# Patient Record
Sex: Female | Born: 1990 | Race: Black or African American | Hispanic: No | Marital: Single | Smoking: Current every day smoker
Health system: Southern US, Community
[De-identification: ages and names within clinical notes are randomized; demographics above are authoritative.]

## PROBLEM LIST (undated history)

## (undated) DIAGNOSIS — E669 Obesity, unspecified: Secondary | ICD-10-CM

## (undated) HISTORY — DX: Obesity, unspecified: E66.9

## (undated) HISTORY — PX: TONSILLECTOMY: SUR1361

---
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
## 2009-09-27 ENCOUNTER — Emergency Department: Payer: Self-pay | Admitting: Emergency Medicine

## 2010-12-13 ENCOUNTER — Emergency Department: Payer: Self-pay | Admitting: Emergency Medicine

## 2012-04-22 ENCOUNTER — Emergency Department: Payer: Self-pay | Admitting: Emergency Medicine

## 2013-06-08 ENCOUNTER — Emergency Department: Payer: Self-pay | Admitting: Emergency Medicine

## 2013-06-08 LAB — CBC
HCT: 40 % (ref 35.0–47.0)
HGB: 13.2 g/dL (ref 12.0–16.0)
MCH: 27.8 pg (ref 26.0–34.0)
MCHC: 33.1 g/dL (ref 32.0–36.0)
MCV: 84 fL (ref 80–100)
Platelet: 356 10*3/uL (ref 150–440)
RBC: 4.77 10*6/uL (ref 3.80–5.20)
RDW: 13.2 % (ref 11.5–14.5)
WBC: 11.9 10*3/uL — AB (ref 3.6–11.0)

## 2013-06-08 LAB — HCG, QUANTITATIVE, PREGNANCY: Beta Hcg, Quant.: 86926 m[IU]/mL — ABNORMAL HIGH

## 2013-07-16 ENCOUNTER — Emergency Department: Payer: Self-pay | Admitting: Emergency Medicine

## 2013-07-16 LAB — COMPREHENSIVE METABOLIC PANEL
ALK PHOS: 43 U/L — AB
Albumin: 2.9 g/dL — ABNORMAL LOW (ref 3.4–5.0)
Anion Gap: 7 (ref 7–16)
BUN: 5 mg/dL — AB (ref 7–18)
Bilirubin,Total: 0.2 mg/dL (ref 0.2–1.0)
Calcium, Total: 9.1 mg/dL (ref 8.5–10.1)
Chloride: 105 mmol/L (ref 98–107)
Co2: 25 mmol/L (ref 21–32)
Creatinine: 0.57 mg/dL — ABNORMAL LOW (ref 0.60–1.30)
EGFR (African American): >60
EGFR (Non-African Amer.): >60
GLUCOSE: 85 mg/dL (ref 65–99)
Osmolality: 270 (ref 275–301)
Potassium: 3.8 mmol/L (ref 3.5–5.1)
SGOT(AST): 45 U/L — ABNORMAL HIGH (ref 15–37)
SGPT (ALT): 68 U/L (ref 12–78)
Sodium: 137 mmol/L (ref 136–145)
Total Protein: 6.8 g/dL (ref 6.4–8.2)

## 2013-07-16 LAB — URINALYSIS, COMPLETE
BILIRUBIN, UR: NEGATIVE
Bacteria: NONE SEEN
Blood: NEGATIVE
Glucose,UR: NEGATIVE mg/dL (ref 0–75)
Ketone: NEGATIVE
Nitrite: NEGATIVE
Ph: 5 (ref 4.5–8.0)
Protein: NEGATIVE
RBC,UR: 2 /HPF (ref 0–5)
Specific Gravity: 1.028 (ref 1.003–1.030)
WBC UR: 16 /HPF (ref 0–5)

## 2013-07-16 LAB — CBC WITH DIFFERENTIAL/PLATELET
BASOS ABS: 0.1 10*3/uL (ref 0.0–0.1)
BASOS PCT: 0.8 %
EOS ABS: 0.1 10*3/uL (ref 0.0–0.7)
EOS PCT: 0.5 %
HCT: 37.3 % (ref 35.0–47.0)
HGB: 12.1 g/dL (ref 12.0–16.0)
LYMPHS ABS: 1.9 10*3/uL (ref 1.0–3.6)
Lymphocyte %: 15.8 %
MCH: 27.5 pg (ref 26.0–34.0)
MCHC: 32.4 g/dL (ref 32.0–36.0)
MCV: 85 fL (ref 80–100)
MONOS PCT: 7 %
Monocyte #: 0.8 x10 3/mm (ref 0.2–0.9)
NEUTROS ABS: 9 10*3/uL — AB (ref 1.4–6.5)
Neutrophil %: 75.9 %
Platelet: 271 10*3/uL (ref 150–440)
RBC: 4.4 10*6/uL (ref 3.80–5.20)
RDW: 13.9 % (ref 11.5–14.5)
WBC: 11.8 10*3/uL — ABNORMAL HIGH (ref 3.6–11.0)

## 2013-07-21 ENCOUNTER — Emergency Department: Payer: Self-pay | Admitting: Emergency Medicine

## 2013-07-21 LAB — CBC
HCT: 40 % (ref 35.0–47.0)
HGB: 12.7 g/dL (ref 12.0–16.0)
MCH: 26.9 pg (ref 26.0–34.0)
MCHC: 31.8 g/dL — ABNORMAL LOW (ref 32.0–36.0)
MCV: 85 fL (ref 80–100)
PLATELETS: 311 10*3/uL (ref 150–440)
RBC: 4.74 10*6/uL (ref 3.80–5.20)
RDW: 14.1 % (ref 11.5–14.5)
WBC: 11.6 10*3/uL — AB (ref 3.6–11.0)

## 2013-07-21 LAB — URINALYSIS, COMPLETE
Bacteria: NONE SEEN
Bilirubin,UR: NEGATIVE
GLUCOSE, UR: NEGATIVE mg/dL (ref 0–75)
KETONE: NEGATIVE
NITRITE: NEGATIVE
PROTEIN: NEGATIVE
Ph: 5 (ref 4.5–8.0)
RBC,UR: 1 /HPF (ref 0–5)
Specific Gravity: 1.018 (ref 1.003–1.030)

## 2013-07-21 LAB — WET PREP, GENITAL

## 2013-07-21 LAB — HCG, QUANTITATIVE, PREGNANCY: Beta Hcg, Quant.: 43557 m[IU]/mL — ABNORMAL HIGH

## 2013-07-21 LAB — GC/CHLAMYDIA PROBE AMP

## 2013-10-01 ENCOUNTER — Observation Stay: Payer: Self-pay

## 2013-10-16 ENCOUNTER — Observation Stay: Payer: Self-pay | Admitting: Obstetrics and Gynecology

## 2013-12-14 HISTORY — PX: OTHER SURGICAL HISTORY: SHX169

## 2013-12-25 ENCOUNTER — Observation Stay: Payer: Self-pay | Admitting: Obstetrics & Gynecology

## 2014-01-05 ENCOUNTER — Inpatient Hospital Stay: Payer: Self-pay | Admitting: Obstetrics and Gynecology

## 2014-01-05 LAB — CBC WITH DIFFERENTIAL/PLATELET
BASOS ABS: 0.1 10*3/uL (ref 0.0–0.1)
Basophil %: 0.6 %
EOS ABS: 0 10*3/uL (ref 0.0–0.7)
Eosinophil %: 0.3 %
HCT: 39.4 % (ref 35.0–47.0)
HGB: 12.4 g/dL (ref 12.0–16.0)
LYMPHS ABS: 1.3 10*3/uL (ref 1.0–3.6)
Lymphocyte %: 9.1 %
MCH: 25.7 pg — AB (ref 26.0–34.0)
MCHC: 31.6 g/dL — AB (ref 32.0–36.0)
MCV: 81 fL (ref 80–100)
Monocyte #: 0.9 x10 3/mm (ref 0.2–0.9)
Monocyte %: 6 %
Neutrophil #: 12 10*3/uL — ABNORMAL HIGH (ref 1.4–6.5)
Neutrophil %: 84 %
PLATELETS: 290 10*3/uL (ref 150–440)
RBC: 4.84 10*6/uL (ref 3.80–5.20)
RDW: 14.5 % (ref 11.5–14.5)
WBC: 14.2 10*3/uL — AB (ref 3.6–11.0)

## 2014-01-07 LAB — HEMATOCRIT: HCT: 33.6 % — ABNORMAL LOW (ref 35.0–47.0)

## 2014-06-23 NOTE — H&P (Signed)
L&D Evaluation:  History:  HPI 24 yo G1 at 423w6d, EDD of 01/09/14 per LMP & 15 wk US, presents with reports of decreased fetal movement for the last 3 days. She also reports not eating or drinking anything since last night. She denies vb, lof or ctx. [facility], tx from [facility] at 18 wks. She is O+, RI, VI.   Presents with decreased fetal movement   Patient's Medical History No Chronic Illness   Patient's Surgical History tonsillectomy   Medications Pre Natal Vitamins  Diclegis   Allergies NKDA   Social History none   Family History Non-Contributory   Exam:  Vital Signs stable   General no apparent distress   Mental Status clear   Abdomen gravid, non-tender   FHT normal rate with no decels, 135-140 baseline, +accels, no decels noted, moderate variability + movement heard on monitor and felt by patient   Ucx absent   Skin dry, no lesions, no rashes   Lymph no lymphadenopathy   Impression:  Impression IUP at 3923w6d, Reactive NST- Cat 1 FHT- appropriate for gestational age.   Plan:  Plan discharge   Comments FKC discussed. PTL precautions.   Follow Up Appointment already scheduled   Electronic Signatures: Jannet MantisSubudhi, Bayli Quesinberry (CNM)  (Signed 03-Sep-15 10:57)  Authored: L&D Evaluation   Last Updated: 03-Sep-15 10:57 by Jannet MantisSubudhi, Geraldina Parrott (CNM)

## 2014-06-23 NOTE — H&P (Signed)
L&D Evaluation:  History Expanded:  HPI 24 yo G1 at 3335w5d, EDD of 01/09/14 per LMP & 15 wk US, presents with c/o vaginal spotting and discharge with odor and itching. [facility] from [facility] at 18 wks. NO VB, LOF, decreased FM or contractions noted.   Presents with discharge   Patient's Medical History No Chronic Illness   Patient's Surgical History tonsillectomy   Medications Pre Natal Vitamins  Diclegis   Allergies NKDA   Social History none   Exam:  Vital Signs stable   General no apparent distress   Mental Status clear   Abdomen gravid, non-tender   Pelvic wet mount: +yeast +clue + whiff, neg trich   FHT +FHTs   Ucx absent   Impression:  Impression IUP at 25 wks, BV, vaginal yeast   Plan:  Plan discharge   Comments Rx for Diflucan and Flagyl given   Follow Up Appointment already scheduled   Electronic Signatures: Vella KohlerBrothers, Donna David (CNM)  (Signed 19-Aug-15 23:33)  Authored: L&D Evaluation   Last Updated: 19-Aug-15 23:33 by Vella KohlerBrothers, Donna David (CNM)

## 2014-06-23 NOTE — H&P (Signed)
L&D Evaluation:  History Expanded:  HPI 24 yo G1 at 3259w3d, EDD of 01/09/14 per LMP & 15 wk US, presents with regular contractions and vaginal bleeding this am. Denies LOF or decreased FM. [facility], [facility]. UDS + MJ in April, negative on 12/24/13. BMI 38, early 1 hr 123, 28 wk 1 hr: 152, she has not had 3 hr GTT but checked FSBS for a few days, only 6/20 elevated. Pt has not checked her FSBS this past week as requested. Had TDAP at 30 wks. Plans to breastfeed/pump. Nexplanon desired PP, device at office.   Blood Type (Maternal) O positive   Group B Strep Results Maternal (Result >5wks must be treated as unknown) positive   Maternal HIV Negative   Maternal Syphilis Ab Nonreactive   Maternal Varicella Immune   Rubella Results (Maternal) immune   Maternal T-Dap Immune   Presents with contractions, vaginal bleeding   Patient's Medical History No Chronic Illness   Patient's Surgical History tonsillectomy & adenoidectomy   Medications Pre Natal Vitamins   Allergies NKDA   Social History none  +MJ in early pregnancy, negative in November   Family History Non-Contributory   ROS:  ROS All systems were reviewed.  HEENT, CNS, GI, GU, Respiratory, CV, Renal and Musculoskeletal systems were found to be normal.   Exam:  Vital Signs stable   Urine Protein not completed   General no apparent distress   Mental Status clear   Chest clear   Heart no murmur/gallop/rubs   Abdomen gravid, tender with contractions   Estimated Fetal Weight Average for gestational age, EFW 40% per US at 37 wks   Fetal Position vertex   Pelvic 4.5/90/-1   Mebranes Intact   FHT normal rate with no decels, baseline 130, mod variability, + accelerations, no decels   Ucx regular, q 5-7 min   Impression:  Impression early labor   Plan:  Comments Admission for labor, begin Ampicillin for + GBS. Encourage ambulation as tolerated Random glucose: 82 about 3 hrs after eating    Electronic Signatures: Vella KohlerBrothers, Daziah Hesler K (CNM)  (Signed 23-Nov-15 14:55)  Authored: L&D Evaluation   Last Updated: 23-Nov-15 14:55 by Vella KohlerBrothers, Letecia Arps K (CNM)

## 2014-06-23 NOTE — H&P (Signed)
L&D Evaluation:  History:  HPI 24 yo G1 at 8938w1d, EDD of 01/09/14 per LMP & 15 wk US, presents with some mild contractions and vaginal discharge.  Patient had intercourse last night.    Denies LOF, VB, +FM.  [facility] from [facility] at 18 wks.  She is O+, RI, VI.   Presents with contractions, decreased fetal movement   Patient's Medical History No Chronic Illness   Patient's Surgical History tonsillectomy   Medications Pre Natal Vitamins  Diclegis   Allergies NKDA   Social History none   Family History Non-Contributory   ROS:  ROS All systems were reviewed.  HEENT, CNS, GI, GU, Respiratory, CV, Renal and Musculoskeletal systems were found to be normal.   Exam:  Vital Signs stable   Urine Protein not completed   General no apparent distress   Mental Status clear   Abdomen gravid, non-tender   Estimated Fetal Weight Average for gestational age   Fetal Position cephalic by leopolds   Pelvic C/L/H   Mebranes Intact   FHT normal rate with no decels   Fetal Heart Rate 125   Impression:  Impression irregular contractions   Plan:  Plan discharge   Comments Dicussed labor signs and symptoms, call for ctx 5min apart for at least an hour, VB, LOF or decreased FM. Patient to be discharged home, keep next appointment.   Follow Up Appointment already scheduled   Electronic Signatures: Kendalyn Cranfield, Elenora Fenderhelsea C (MD)  (Signed 12-Nov-15 20:09)  Authored: L&D Evaluation   Last Updated: 12-Nov-15 20:09 by Markell Schrier, Elenora Fenderhelsea C (MD)

## 2014-12-29 ENCOUNTER — Emergency Department
Admission: EM | Admit: 2014-12-29 | Discharge: 2014-12-29 | Disposition: A | Discharge status: Home / Self Care | Payer: Self-pay | Attending: Student | Admitting: Student

## 2014-12-29 ENCOUNTER — Encounter: Payer: Self-pay | Admitting: Emergency Medicine

## 2014-12-29 DIAGNOSIS — F17200 Nicotine dependence, unspecified, uncomplicated: Secondary | ICD-10-CM | POA: Insufficient documentation

## 2014-12-29 DIAGNOSIS — Z3202 Encounter for pregnancy test, result negative: Secondary | ICD-10-CM | POA: Insufficient documentation

## 2014-12-29 DIAGNOSIS — N390 Urinary tract infection, site not specified: Secondary | ICD-10-CM | POA: Insufficient documentation

## 2014-12-29 LAB — URINALYSIS COMPLETE WITH MICROSCOPIC (ARMC ONLY)
Bilirubin Urine: NEGATIVE
GLUCOSE, UA: NEGATIVE mg/dL
Ketones, ur: NEGATIVE mg/dL
Nitrite: POSITIVE — AB
Protein, ur: 100 mg/dL — AB
Specific Gravity, Urine: 1.021 (ref 1.005–1.030)
pH: 7 (ref 5.0–8.0)

## 2014-12-29 LAB — POCT PREGNANCY, URINE: Preg Test, Ur: NEGATIVE

## 2014-12-29 MED ORDER — CEPHALEXIN 500 MG PO CAPS: 500.0000 mg | ORAL_CAPSULE | Freq: Four times a day (QID) | ORAL | Status: AC

## 2014-12-29 NOTE — ED Notes (Signed)
Pt to ed with c/o urinary frequency, burning, and pain x 2 days.

## 2014-12-29 NOTE — ED Provider Notes (Signed)
CSN: [id number]     Arrival date & time 12/29/14  1314 History   First MD Initiated Contact with Patient 12/29/14 1408     Chief Complaint  Patient presents with  . Urinary Frequency     HPI Comments: [age] year old female presents today complaining of urinary frequency burning and pain. She's not had any back or lower abdominal pain. No fevers or chills. Has not taken anything over-the-counter. Her last normal menstrual period was over a year ago, she uses Nexplanon for birth control.  The history is provided by the patient.    History reviewed. No pertinent past medical history. History reviewed. No pertinent past surgical history. History reviewed. No pertinent family history. Social History  Substance Use Topics  . Smoking status: Current Every Day Smoker  . Smokeless tobacco: None  . Alcohol Use: Yes   OB History    Gravida Para Term Preterm AB TAB SAB Ectopic Multiple Living   1         1     Review of Systems  Genitourinary: Positive for dysuria, urgency and frequency. Negative for flank pain, vaginal bleeding, vaginal discharge and pelvic pain.  All other systems reviewed and are negative.     Allergies  Review of patient's allergies indicates no known allergies.  Home Medications   Prior to Admission medications   Medication Sig Start Date End Date Taking? Authorizing Provider  cephALEXin (KEFLEX) 500 MG capsule Take 1 capsule (500 mg total) by mouth 4 (four) times daily. 12/29/14 01/08/15  Wilber OliphantEmma Weavil V, PA-C   BP 109/66 mmHg  Pulse 92  Temp(Src) 98.1 F (36.7 C) (Oral)  Resp 20  Ht 5\' 11"  (1.803 m)  Wt 250 lb (113.399 kg)  BMI 34.88 kg/m2  SpO2 100% Physical Exam  Constitutional: She is oriented to person, place, and time. Vital signs are normal. She appears well-developed and well-nourished. She is active.  Non-toxic appearance. She does not have a sickly appearance. She does not appear ill.  HENT:  Head: Normocephalic and atraumatic.  Cardiovascular:  Normal rate, regular rhythm, normal heart sounds and intact distal pulses.  Exam reveals no gallop and no friction rub.   No murmur heard. Pulmonary/Chest: Effort normal and breath sounds normal. No respiratory distress. She has no wheezes. She has no rales.  Abdominal: Soft. She exhibits no distension. There is no tenderness. There is no rebound and no guarding.  No CVA tenderness   Musculoskeletal: Normal range of motion.  Neurological: She is alert and oriented to person, place, and time.  Skin: Skin is warm and dry.  Psychiatric: She has a normal mood and affect. Her behavior is normal. Judgment and thought content normal.  Nursing note and vitals reviewed.   ED Course  Procedures (including critical care time) Labs Review Labs Reviewed  URINALYSIS COMPLETEWITH MICROSCOPIC ([facility] ONLY) - Abnormal; Notable for the following:    Color, Urine AMBER (*)    APPearance CLOUDY (*)    Hgb urine dipstick 1+ (*)    Protein, ur 100 (*)    Nitrite POSITIVE (*)    Leukocytes, UA 2+ (*)    Bacteria, UA RARE (*)    Squamous Epithelial / LPF 6-30 (*)    All other components within normal limits  POC URINE PREG, ED    Imaging Review No results found. I have personally reviewed and evaluated these images and lab results as part of my medical decision-making.   EKG Interpretation None      MDM  Keflex  QID x 10 days Drink plenty of water AZO over the counter  Final diagnoses:  UTI (lower urinary tract infection)        Wilber Oliphant V, PA-C 12/29/14 1527  Gayla Doss, MD 12/30/14 [phone]

## 2015-06-14 ENCOUNTER — Emergency Department
Admission: EM | Admit: 2015-06-14 | Discharge: 2015-06-14 | Disposition: A | Discharge status: Home / Self Care | Payer: Self-pay | Attending: Emergency Medicine | Admitting: Emergency Medicine

## 2015-06-14 ENCOUNTER — Encounter: Payer: Self-pay | Admitting: Emergency Medicine

## 2015-06-14 DIAGNOSIS — F17200 Nicotine dependence, unspecified, uncomplicated: Secondary | ICD-10-CM | POA: Insufficient documentation

## 2015-06-14 DIAGNOSIS — H6503 Acute serous otitis media, bilateral: Secondary | ICD-10-CM | POA: Insufficient documentation

## 2015-06-14 DIAGNOSIS — H6693 Otitis media, unspecified, bilateral: Secondary | ICD-10-CM

## 2015-06-14 DIAGNOSIS — J0140 Acute pansinusitis, unspecified: Secondary | ICD-10-CM | POA: Insufficient documentation

## 2015-06-14 MED ORDER — AMOXICILLIN 500 MG PO CAPS
500.0000 mg | ORAL_CAPSULE | Freq: Three times a day (TID) | ORAL | Status: DC
Start: 2015-06-14 — End: 2018-07-16

## 2015-06-14 NOTE — ED Provider Notes (Signed)
[facility] Emergency Department Provider Note  ____________________________________________  Time seen: Approximately 5:04 PM  I have reviewed the triage vital signs and the nursing notes.   HISTORY  Chief Complaint Dental Pain   HPI Donna David is a 25 y.o. female, NAD, presents to the emergency room today with left facial and dental pain for the past few days. She states she went to the dentist today and told her that nothing was wrong with her teeth. A child at her home recently had an ear infection. Denies fever, cough, SOB, and chest pain.   History reviewed. No pertinent past medical history.  There are no active problems to display for this patient.   History reviewed. No pertinent past surgical history.  Current Outpatient Rx  Name  Route  Sig  Dispense  Refill  . amoxicillin (AMOXIL) 500 MG capsule   Oral   Take 1 capsule (500 mg total) by mouth 3 (three) times daily.   30 capsule   0     Allergies Review of patient's allergies indicates no known allergies.  No family history on file.  Social History Social History  Substance Use Topics  . Smoking status: Current Every Day Smoker  . Smokeless tobacco: None  . Alcohol Use: Yes    Review of Systems Constitutional: No fever/chills Eyes: No visual changes. ENT: No sore throat. Positive for facial pain. Cardiovascular: Denies chest pain. Respiratory: Denies shortness of breath. Gastrointestinal: No abdominal pain.  No nausea, no vomiting.  No diarrhea.  No constipation. Skin: Negative for rash. Neurological: Positivefor headaches. Negative for focal weakness or numbness.  ____________________________________________   PHYSICAL EXAM:  VITAL SIGNS: ED Triage Vitals  Enc Vitals Group     BP 06/14/15 1637 125/83 mmHg     Pulse Rate 06/14/15 1637 86     Resp 06/14/15 1637 18     Temp 06/14/15 1637 98.2 F (36.8 C)     Temp Source 06/14/15 1637 Oral     SpO2 06/14/15  1637 100 %     Weight 06/14/15 1637 260 lb (117.935 kg)     Height 06/14/15 1637  (1.803 m)     Head Cir --      Peak Flow --      Pain Score 06/14/15 1638 9     Pain Loc --      Pain Edu? --      Excl. in GC? --     Constitutional: Alert and oriented. Well appearing and in no acute distress. Eyes: Conjunctivae are normal. EOMI. Ears: Left ear- erythematous, mildly bulging, decreased light reflex. TM intact but with fluid collection behind it. Right ear- decreased light reflex, TM intact without fluid collection. Head: Atraumatic. Nose: No congestion/rhinnorhea. Mouth/Throat: Mucous membranes are moist.  Oropharynx non-erythematous. No edema or signs of ludwigs angina. Hematological/Lymphatic/Immunilogical: Mild cervical lymphadenopathy. Cardiovascular: Good peripheral circulation. Respiratory: Normal respiratory effort.  No retractions. Neurologic:  Normal speech and language. No gross focal neurologic deficits are appreciated. No gait instability. Skin:  Skin is warm, dry and intact. No rash noted. Psychiatric: Mood and affect are normal. Speech and behavior are normal.  ____________________________________________ PROCEDURES  Procedure(s) performed: None  Critical Care performed: No  ____________________________________________   INITIAL IMPRESSION / ASSESSMENT AND PLAN / ED COURSE  Pertinent labs & imaging results that were available during my care of the patient were reviewed by me and considered in my medical decision making (see chart for details).  Bilateral acute otitis  media. Prescription for amoxil x 10 days given upon discharge. Advised to use flonase to reduce the pressure that she is experiencing in her sinuses. She was advised to continue to take OTC pain relievers. Advised to schedule an appointment with Dr. Elenore RotaJuengel if symptoms worsen or persist. ____________________________________________   FINAL CLINICAL IMPRESSION(S) / ED DIAGNOSES  Final  diagnoses:  Bilateral acute otitis media, recurrence not specified, unspecified otitis media type  Acute pansinusitis, recurrence not specified      NEW MEDICATIONS STARTED DURING THIS VISIT:  Discharge Medication List as of 06/14/2015  5:11 PM    START taking these medications   Details  amoxicillin (AMOXIL) 500 MG capsule Take 1 capsule (500 mg total) by mouth 3 (three) times daily., Starting 06/14/2015, Until Discontinued, Print         Note:  This document was prepared using Dragon voice recognition software and may include unintentional dictation errors.    Tommi RumpsRhonda L Hartleigh Edmonston, PA-C 06/14/15 1733  Loleta Roseory Forbach, MD 06/14/15 2113

## 2015-06-14 NOTE — ED Notes (Signed)
Left side of mouth and ear hurt

## 2015-06-14 NOTE — Discharge Instructions (Signed)
Otitis Media, Adult Otitis media is redness, soreness, and puffiness (swelling) in the space just behind your eardrum (middle ear). It may be caused by allergies or infection. It often happens along with a cold. HOME CARE  Take your medicine as told. Finish it even if you start to feel better.  Only take over-the-counter or prescription medicines for pain, discomfort, or fever as told by your doctor.  Follow up with your doctor as told. GET HELP IF:  You have otitis media only in one ear, or bleeding from your nose, or both.  You notice a lump on your neck.  You are not getting better in 3-5 days.  You feel worse instead of better. GET HELP RIGHT AWAY IF:   You have pain that is not helped with medicine.  You have puffiness, redness, or pain around your ear.  You get a stiff neck.  You cannot move part of your face (paralysis).  You notice that the bone behind your ear hurts when you touch it. MAKE SURE YOU:   Understand these instructions.  Will watch your condition.  Will get help right away if you are not doing well or get worse.   This information is not intended to replace advice given to you by your health care provider. Make sure you discuss any questions you have with your health care provider.   Document Released: 07/19/2007 Document Revised: 02/20/2014 Document Reviewed: 08/27/2012 Elsevier Interactive Patient Education [employer]  Please take all antibiotics prescribed to lessen your chance of recurrence. Make an appointment with the above physician if symptoms worsen or persist.

## 2015-06-14 NOTE — ED Notes (Signed)
States she has had left ear pain and gumline pain for about 5 days  Afebrile on arrival

## 2018-01-12 ENCOUNTER — Emergency Department: Payer: Self-pay

## 2018-01-12 ENCOUNTER — Emergency Department
Admission: EM | Admit: 2018-01-12 | Discharge: 2018-01-12 | Discharge status: Against Medical Advice | Payer: Self-pay | Attending: Emergency Medicine | Admitting: Emergency Medicine

## 2018-01-12 ENCOUNTER — Encounter: Payer: Self-pay | Admitting: Emergency Medicine

## 2018-01-12 ENCOUNTER — Other Ambulatory Visit: Payer: Self-pay

## 2018-01-12 DIAGNOSIS — N939 Abnormal uterine and vaginal bleeding, unspecified: Secondary | ICD-10-CM | POA: Insufficient documentation

## 2018-01-12 DIAGNOSIS — Z5320 Procedure and treatment not carried out because of patient's decision for unspecified reasons: Secondary | ICD-10-CM | POA: Insufficient documentation

## 2018-01-12 DIAGNOSIS — F17210 Nicotine dependence, cigarettes, uncomplicated: Secondary | ICD-10-CM | POA: Insufficient documentation

## 2018-01-12 LAB — URINALYSIS, COMPLETE (UACMP) WITH MICROSCOPIC
Bilirubin Urine: NEGATIVE
Glucose, UA: NEGATIVE mg/dL
Ketones, ur: NEGATIVE mg/dL
NITRITE: NEGATIVE
Protein, ur: 30 mg/dL — AB
RBC / HPF: >50 RBC/hpf — ABNORMAL HIGH (ref 0–5)
Specific Gravity, Urine: 1.028 (ref 1.005–1.030)
pH: 6 (ref 5.0–8.0)

## 2018-01-12 LAB — CBC WITH DIFFERENTIAL/PLATELET
Abs Immature Granulocytes: 0.06 10*3/uL (ref 0.00–0.07)
Basophils Absolute: 0.1 10*3/uL (ref 0.0–0.1)
Basophils Relative: 1 %
Eosinophils Absolute: 0.1 10*3/uL (ref 0.0–0.5)
Eosinophils Relative: 1 %
HCT: 42.3 % (ref 36.0–46.0)
Hemoglobin: 13.3 g/dL (ref 12.0–15.0)
Immature Granulocytes: 1 %
LYMPHS PCT: 21 %
Lymphs Abs: 2.7 10*3/uL (ref 0.7–4.0)
MCH: 25.5 pg — ABNORMAL LOW (ref 26.0–34.0)
MCHC: 31.4 g/dL (ref 30.0–36.0)
MCV: 81.2 fL (ref 80.0–100.0)
Monocytes Absolute: 0.7 10*3/uL (ref 0.1–1.0)
Monocytes Relative: 5 %
Neutro Abs: 9.3 10*3/uL — ABNORMAL HIGH (ref 1.7–7.7)
Neutrophils Relative %: 71 %
Platelets: 486 10*3/uL — ABNORMAL HIGH (ref 150–400)
RBC: 5.21 MIL/uL — ABNORMAL HIGH (ref 3.87–5.11)
RDW: 14.4 % (ref 11.5–15.5)
WBC: 13 10*3/uL — ABNORMAL HIGH (ref 4.0–10.5)
nRBC: 0 % (ref 0.0–0.2)

## 2018-01-12 LAB — BASIC METABOLIC PANEL
Anion gap: 6 (ref 5–15)
BUN: 13 mg/dL (ref 6–20)
CHLORIDE: 109 mmol/L (ref 98–111)
CO2: 24 mmol/L (ref 22–32)
Calcium: 9.2 mg/dL (ref 8.9–10.3)
Creatinine, Ser: 0.72 mg/dL (ref 0.44–1.00)
GFR calc Af Amer: >60 mL/min (ref >60)
GFR calc non Af Amer: >60 mL/min (ref >60)
Glucose, Bld: 137 mg/dL — ABNORMAL HIGH (ref 70–99)
Potassium: 3.7 mmol/L (ref 3.5–5.1)
Sodium: 139 mmol/L (ref 135–145)

## 2018-01-12 LAB — HCG, QUANTITATIVE, PREGNANCY: hCG, Beta Chain, Quant, S: <1 m[IU]/mL (ref <5)

## 2018-01-12 LAB — POCT PREGNANCY, URINE: Preg Test, Ur: NEGATIVE

## 2018-01-12 NOTE — ED Triage Notes (Signed)
Pt arrives POV to triage with c/o vaginal bleeding since 1400 this afternoon. Pt reports that she has gone through two pads in this time. Pt is ambulatory to triage and in NAD.

## 2018-01-12 NOTE — ED Provider Notes (Signed)
[facility] Emergency Department Provider Note ___   First MD Initiated Contact with Patient 01/12/18 0421     (approximate)  I have reviewed the triage vital signs and the nursing notes.   HISTORY  Chief Complaint Vaginal Bleeding    HPI Donna David is a 27 y.o. female presents to the emergency department with vaginal bleeding since 2 PM yesterday afternoon.  Patient states last menstrual period was the second of this month.  Patient denies any abdominal or pelvic pain.  Patient denies any fever.  Patient denies any urinary symptoms.  Patient states that her menses are usually "regular".  Patient states that she is used to pads since onset of bleeding.  Patient states the first was not saturated however the second pad was.    Past medical history None There are no active problems to display for this patient.   Surgical history None  Prior to Admission medications   Medication Sig Start Date End Date Taking? Authorizing Provider  amoxicillin (AMOXIL) 500 MG capsule Take 1 capsule (500 mg total) by mouth 3 (three) times daily. 06/14/15   Tommi RumpsSummers, Rhonda L, PA-C    Allergies No known drug allergies No family history on file.  Social History Social History   Tobacco Use  . Smoking status: Current Every Day Smoker  . Smokeless tobacco: Never Used  Substance Use Topics  . Alcohol use: Yes  . Drug use: No    Review of Systems Constitutional: No fever/chills Eyes: No visual changes. ENT: No sore throat. Cardiovascular: Negative for chest pain. Respiratory: Denies shortness of breath. Gastrointestinal: No abdominal pain.  No nausea, no vomiting.  No diarrhea.  No constipation. Genitourinary: Negative for dysuria.  Positive for vaginal bleeding Musculoskeletal: Negative for neck pain.  Negative for back pain. Integumentary: Negative for rash. Neurological: Negative for headaches, focal weakness or  numbness.   ____________________________________________   PHYSICAL EXAM:  VITAL SIGNS: ED Triage Vitals  Enc Vitals Group     BP 01/12/18 0120 135/74     Pulse Rate 01/12/18 0120 83     Resp 01/12/18 0120 18     Temp 01/12/18 0120 (!) 97.4 F (36.3 C)     Temp Source 01/12/18 0120 Oral     SpO2 01/12/18 0120 98 %     Weight 01/12/18 0121 117.9 kg (260 lb)     Height 01/12/18 0121 1.803 m (5\' 11" )     Head Circumference --      Peak Flow --      Pain Score 01/12/18 0120 2     Pain Loc --      Pain Edu? --      Excl. in GC? --     Constitutional: Alert and oriented. Well appearing and in no acute distress. Eyes: Conjunctivae are normal.  Mouth/Throat: Mucous membranes are moist.  Oropharynx non-erythematous. Neck: No stridor.   Cardiovascular: Normal rate, regular rhythm. Good peripheral circulation. Grossly normal heart sounds. Respiratory: Normal respiratory effort.  No retractions. Lungs CTAB. Gastrointestinal: Soft and nontender. No distention.  Genitourinary: Not performed this patient eloped from the emergency department  Musculoskeletal: No lower extremity tenderness nor edema. No gross deformities of extremities. Neurologic:  Normal speech and language. No gross focal neurologic deficits are appreciated.  Skin:  Skin is warm, dry and intact. No rash noted. Psychiatric: Mood and affect are normal. Speech and behavior are normal. ____________________________________________   LABS (all labs ordered are listed, but only abnormal results are displayed)  Labs Reviewed  URINALYSIS, COMPLETE (UACMP) WITH MICROSCOPIC - Abnormal; Notable for the following components:      Result Value   Color, Urine YELLOW (*)    APPearance CLEAR (*)    Hgb urine dipstick LARGE (*)    Protein, ur 30 (*)    Leukocytes, UA SMALL (*)    RBC / HPF >50 (*)    Bacteria, UA RARE (*)    All other components within normal limits  CBC WITH DIFFERENTIAL/PLATELET - Abnormal; Notable for the  following components:   WBC 13.0 (*)    RBC 5.21 (*)    MCH 25.5 (*)    Platelets 486 (*)    Neutro Abs 9.3 (*)    All other components within normal limits  BASIC METABOLIC PANEL - Abnormal; Notable for the following components:   Glucose, Bld 137 (*)    All other components within normal limits  HCG, QUANTITATIVE, PREGNANCY  POC URINE PREG, ED  POCT PREGNANCY, URINE    RADIOLOGY I, Revillo N BROWN, personally viewed and evaluated these images (plain radiographs) as part of my medical decision making, as well as reviewing the written report by the radiologist.  ED MD interpretation: Unremarkable pelvic ultrasound per radiologist  Official radiology report(s): US Pelvic Complete With Transvaginal  Result Date: 01/12/2018 CLINICAL DATA:  Dysfunctional vaginal bleeding. EXAM: TRANSABDOMINAL AND TRANSVAGINAL ULTRASOUND OF PELVIS TECHNIQUE: Both transabdominal and transvaginal ultrasound examinations of the pelvis were performed. Transabdominal technique was performed for global imaging of the pelvis including uterus, ovaries, adnexal regions, and pelvic cul-de-sac. It was necessary to proceed with endovaginal exam following the transabdominal exam to visualize the ovaries, adnexa, uterus and endometrium. COMPARISON:  None FINDINGS: Uterus Measurements: 8.0 x 4.1 x 4.5 cm = volume: 75 mL. No fibroids or other mass visualized. Endometrium Thickness: 6 mm, normal.  No focal abnormality visualized. Right ovary Measurements: 2.5 x 1.6 x 2.0 cm = volume: 4.3 mL. Multiple follicles (however less than 20) which are slightly peripherally distributed. No cyst or mass. Normal blood flow. Left ovary Measurements: 3.0 x 1.8 x 2.6 cm = volume: 7.5 mL. Multiple follicles (however less than 20) which are peripherally distributed. No cyst or mass. Normal flow. Other findings No abnormal free fluid. IMPRESSION: 1. Unremarkable sonographic appearance of the uterus and endometrium. 2. Multiple follicles in both  ovaries which are peripherally distributed, however ovaries do not meet sonographic criteria for polycystic ovarian syndrome. Electronically Signed   By: Narda Rutherford M.D.   On: 01/12/2018 05:03     Procedures   ____________________________________________   INITIAL IMPRESSION / ASSESSMENT AND PLAN / ED COURSE  As part of my medical decision making, I reviewed the following data within the electronic MEDICAL RECORD NUMBER  27 year old female presented with above-stated history and physical exam secondary to vaginal bleeding.  Patient hCG quant less than 1.  Ultrasound revealed no acute abnormality.  Patient eloped from the emergency department before pelvic exam performed.  __________________________________  FINAL CLINICAL IMPRESSION(S) / ED DIAGNOSES  Final diagnoses:  Vaginal bleeding     MEDICATIONS GIVEN DURING THIS VISIT:  Medications - No data to display   ED Discharge Orders    None       Note:  This document was prepared using Dragon voice recognition software and may include unintentional dictation errors.    Darci Current, MD 01/12/18 [id number]

## 2018-02-13 NOTE — L&D Delivery Note (Signed)
Obstetrical Delivery Note   Date of Delivery:   12/22/2018 Primary OB:   [facility] Gestational Age/EDD: [redacted]w[redacted]d (Dated by 18wk3d ultrasound) Antepartum complications: gestational diabetes, intrauterine growth restriction and obesity, MJ use, +Chlamydia  Delivered By:   Dalia Heading, CNM  Delivery Type:   spontaneous vaginal delivery  Procedure Details:   Called to see patient who had urge to push. Easily reduced small anterior lip with first push. Mother then pushed to deliver a vigorous female infant in OA over intact perineum. Baby dried and placed on mother's abdomen. After delayed cord clamping the grandmother to the baby cut the cord. Baby placed on mother's chest, skin to skin. Spontaneous delivery of intact small placenta and 3 vessel cord. The fundus firmed with bimanuel massage and IV Pitocin. Repaired left labial laceration with topical, local and epidural anesthesia. EBL 200 ml Anesthesia:    local, regional and topical Intrapartum complications: Gestational Diabetes, diet controlled, IUGR and Variable decelerations GBS:    negative Laceration:    Left labial laceration Episiotomy:    none Placenta:    Via active 3rd stage. To pathology: yes Estimated Blood Loss:  200 ml Baby:    Liveborn female, Apgars 9/9, weight pending    Dalia Heading, CNM

## 2018-06-11 ENCOUNTER — Other Ambulatory Visit: Payer: Self-pay

## 2018-06-11 ENCOUNTER — Encounter: Payer: Medicaid Other | Admitting: Obstetrics and Gynecology

## 2018-07-04 ENCOUNTER — Other Ambulatory Visit: Payer: Self-pay

## 2018-07-04 ENCOUNTER — Encounter: Payer: Medicaid Other | Admitting: Maternal Newborn

## 2018-07-09 ENCOUNTER — Other Ambulatory Visit: Payer: Self-pay

## 2018-07-09 ENCOUNTER — Encounter: Payer: Medicaid Other | Admitting: Maternal Newborn

## 2018-07-16 ENCOUNTER — Encounter: Payer: Medicaid Other | Admitting: Certified Nurse Midwife

## 2018-07-16 ENCOUNTER — Ambulatory Visit (INDEPENDENT_AMBULATORY_CARE_PROVIDER_SITE_OTHER): Payer: Medicaid Other | Admitting: Certified Nurse Midwife

## 2018-07-16 ENCOUNTER — Other Ambulatory Visit (HOSPITAL_COMMUNITY)
Admission: RE | Admit: 2018-07-16 | Discharge: 2018-07-16 | Disposition: A | Discharge status: Home / Self Care | Payer: Medicaid Other | Source: Ambulatory Visit | Attending: Maternal Newborn | Admitting: Maternal Newborn

## 2018-07-16 ENCOUNTER — Encounter: Payer: Self-pay | Admitting: Certified Nurse Midwife

## 2018-07-16 ENCOUNTER — Other Ambulatory Visit: Payer: Self-pay

## 2018-07-16 VITALS — BP 100/70 | Ht 71.0 in | Wt 263.0 lb

## 2018-07-16 DIAGNOSIS — Z113 Encounter for screening for infections with a predominantly sexual mode of transmission: Secondary | ICD-10-CM | POA: Diagnosis not present

## 2018-07-16 DIAGNOSIS — Z124 Encounter for screening for malignant neoplasm of cervix: Secondary | ICD-10-CM | POA: Diagnosis not present

## 2018-07-16 DIAGNOSIS — O0990 Supervision of high risk pregnancy, unspecified, unspecified trimester: Secondary | ICD-10-CM | POA: Insufficient documentation

## 2018-07-16 DIAGNOSIS — Z3A16 16 weeks gestation of pregnancy: Secondary | ICD-10-CM | POA: Insufficient documentation

## 2018-07-16 DIAGNOSIS — O09292 Supervision of pregnancy with other poor reproductive or obstetric history, second trimester: Secondary | ICD-10-CM | POA: Diagnosis not present

## 2018-07-16 DIAGNOSIS — Z3687 Encounter for antenatal screening for uncertain dates: Secondary | ICD-10-CM

## 2018-07-16 DIAGNOSIS — O99210 Obesity complicating pregnancy, unspecified trimester: Secondary | ICD-10-CM

## 2018-07-16 DIAGNOSIS — O99212 Obesity complicating pregnancy, second trimester: Secondary | ICD-10-CM

## 2018-07-16 DIAGNOSIS — E669 Obesity, unspecified: Secondary | ICD-10-CM | POA: Insufficient documentation

## 2018-07-16 DIAGNOSIS — Z01419 Encounter for gynecological examination (general) (routine) without abnormal findings: Secondary | ICD-10-CM

## 2018-07-16 DIAGNOSIS — Z8759 Personal history of other complications of pregnancy, childbirth and the puerperium: Secondary | ICD-10-CM | POA: Insufficient documentation

## 2018-07-16 MED ORDER — ONDANSETRON HCL 8 MG PO TABS: 8.0000 mg | ORAL_TABLET | Freq: Three times a day (TID) | ORAL | 1 refills | Status: DC | PRN

## 2018-07-16 NOTE — Progress Notes (Signed)
New Obstetric Patient H&P    Chief Complaint: "Desires prenatal care"   History of Present Illness: Patient is a 28 y.o. G19P1001 Black female, LMP 03/25/2018, who presents with amenorrhea and positive home pregnancy test. Based on her  LMP, her EDD is Estimated Date of Delivery: 12/30/18 and her EGA is [redacted]w[redacted]d. Cycles are 3. days, usually regular, and occur approximately every : 28 days. Her last pap smear was approximately 1.5 years ago (January 2019) years ago and was NIL per patient.    She had a urine pregnancy test which was positive 26 March .  Her last menstrual period was normal and lasted for  3 day(s). Since her LMP she claims she has experienced breast tenderness, nausea and vomiting and fatigue.. She denies vaginal bleeding. Her past medical history is remarkable for obesity (current BMI 36.68 kg/m2). Her prior pregnancies are notable for a term vaginal delivery in 2015 delivering a 6#9.6oz baby boy. Her delivery was complicated by a third degree laceration that healed without problem.   Since her LMP, she admits to the use of tobacco products  Yes, but stopped the end of March/ beginning of April as they made her nausea and vomiting worse. She denies use of alcohol since her LMP. She denies use of illicit street drugs. She claims she has gained   6 pounds since the start of her pregnancy.  There are cats in the home in the home  no If yes NA She admits close contact with children on a regular basis  yes  She has had chicken pox in the past yes She has had Tuberculosis exposures, symptoms, or previously tested positive for TB   no Current or past history of domestic violence. no  Genetic Screening/Teratology Counseling: (Includes patient, baby's father, or anyone in either family with:)   1. Patient's age >/= 29 at [facility]  no 2. Thalassemia ([place], [place], Mediterranean, or Asian background): MCV<80  no 3. Neural tube defect (meningomyelocele, spina bifida, anencephaly)  no  4. Congenital heart defect  no  5. Down syndrome  no 6. Tay-Sachs (Jewish, [place]  no 7. Canavan's Disease  no 8. Sickle cell disease or trait (African)  no  9. Hemophilia or other blood disorders  no  10. Muscular dystrophy  no  11. Cystic fibrosis  no  12. Huntington's Chorea  no  13. Mental retardation/autism  Yes, a maternal cousin 49. Other inherited genetic or chromosomal disorder  no 15. Maternal metabolic disorder (DM, PKU, etc)  no 16. Patient or FOB with a child with a birth defect not listed above no  16a. Patient or FOB with a birth defect themselves no 17. Recurrent pregnancy loss, or stillbirth  no  18. Any medications since LMP other than prenatal vitamins (include vitamins, supplements, OTC meds, drugs, alcohol)  Yes, ginger 19. Any other genetic/environmental exposure to discuss  no  Infection History:   1. Lives with someone with TB or TB exposed  no  2. Patient or partner has history of genital herpes  no 3. Rash or viral illness since LMP  no 4. History of STI (GC, CT, HPV, syphilis, HIV)  Remote hx of Chlamydia or gonorrhea 5. History of recent travel :  no  Other pertinent information:  Yes, Joanna Puff, age 9 is the father of this baby and her first child.     Review of Systems:10 point review of systems negative unless otherwise noted in HPI  Past Medical History:  Past Medical History:  Diagnosis Date  . Obesity (BMI 35.0-39.9 without comorbidity)   . Third degree perineal laceration    with G1    Past Surgical History:  Past Surgical History:  Procedure Laterality Date  . Repair of third degree perineal laceration  12/2013  . TONSILLECTOMY      Gynecologic History: Patient's last menstrual period was 03/25/2018.  Obstetric History: G2P1001 OB History  Gravida Para Term Preterm AB Living  2 1 1     1   SAB TAB Ectopic Multiple Live Births          1    # Outcome Date GA Lbr Len/2nd Weight Sex Delivery Anes PTL Lv  2 Current            1 Term 01/06/14 [redacted]w[redacted]d  6 lb 9.6 oz (2.994 kg) M Vag-Spont   LIV     Complications: Third degree perineal laceration   Family History:  Family History  Problem Relation Age of Onset  . Hypertension Mother   . Hypertension Father   . Melanoma Maternal Grandmother   . Heart attack Maternal Grandfather   . Cancer Maternal Great-grandfather   . Heart attack Paternal Grandfather   . Hypertension Paternal Grandfather   . Diabetes Paternal Grandfather   . Hypertension Paternal Uncle   . Autism Cousin   . Stomach cancer Paternal Great-grandmother   . Cancer Maternal Great-grandmother     Social History:  Social History   Socioeconomic History  . Marital status: Significant Other    Spouse name: Vernata  . Number of children: 1  . Years of education: Not on file  . Highest education level: Not on file  Occupational History  . Not on file  Social Needs  . Financial resource strain: Not on file  . Food insecurity:    Worry: Not on file    Inability: Not on file  . Transportation needs:    Medical: Not on file    Non-medical: Not on file  Tobacco Use  . Smoking status: Former Games developer  . Smokeless tobacco: Former Neurosurgeon    Quit date: 05/15/2018  Substance and Sexual Activity  . Alcohol use: Not Currently  . Drug use: No  . Sexual activity: Yes    Partners: Male  Lifestyle  . Physical activity:    Days per week: Not on file    Minutes per session: Not on file  . Stress: Not on file  Relationships  . Social connections:    Talks on phone: Not on file    Gets together: Not on file    Attends religious service: Not on file    Active member of club or organization: Not on file    Attends meetings of clubs or organizations: Not on file    Relationship status: Not on file  . Intimate partner violence:    Fear of current or ex partner: Not on file    Emotionally abused: Not on file    Physically abused: Not on file    Forced sexual activity: Not on file  Other Topics  Concern  . Not on file  Social History Narrative  . Not on file    Allergies:  No Known Allergies  Medications: none Physical Exam Vitals: BP 100/70   Wt 263 lb (119.3 kg)   LMP 03/25/2018   BMI 36.68 kg/m   General: Black female in  NAD HEENT: normocephalic, anicteric  Mouth: normal dentition  OP: no inflammation or exudates Thyroid:  no enlargement, no palpable nodules Pulmonary: No increased work of breathing, CTAB Breasts: soft, NT, no inflammation, no masses, everted nipples Cardiovascular: RRR, without murmur Abdomen: soft, non-tender, non-distended.  Umbilicus without lesions.  No hepatomegaly. FH palpable about 4 FB above SP. Large pannus present. No evidence of hernia. FHT 160 with DT  Genitourinary:  External: Normal external female genitalia.  Normal urethral meatus, normal Bartholin's and Skene's glands.    Vagina: Normal vaginal mucosa, no evidence of prolapse.    Cervix: extremely posterior, no bleeding, blind Pap done  Uterus: AF, 12-14 week size, mobile, normal contour. Difficult exam due to body habitus  Adnexa: ovaries non-enlarged, no adnexal masses  Rectal: deferred Extremities: no edema, erythema, or tenderness Neurologic: Grossly intact Psychiatric: mood appropriate, affect full   Assessment: 28 y.o. G2P1001 at 3459w1d presenting to initiate prenatal care with unsure dating and S<D Late entry to care Obesity with BMI36.68 kg/m2 Hx of third degree perineal laceration  Plan: 1) Avoid alcoholic beverages. 2) Patient encouraged not to smoke.  3) Discontinue the use of all non-medicinal drugs and chemicals.  4) Take prenatal vitamins daily. Given samples of Prenate mini and Concept DHA 5) Nutrition, food safety (fish, cheese advisories, and high nitrite foods) and exercise discussed. (handouts given) 6) Hospital and practice style discussed with cross coverage system.  7) Genetic Screening, such as with cell free fetal DNA, quad screening,  AFP testing,  and Ultrasound, is discussed with patient. At the conclusion of today's visit patient declined genetic testing 8) Patient is asked about travel to areas at risk for the [place] virus, and counseled to avoid travel and exposure to mosquitoes or sexual partners who may have themselves been exposed to the virus. Testing is discussed, and will be ordered as appropriate.  9) NOB labs today. Need to get UDS and urine culture at next visit 10) RX for Zofran 8 mgm  called to pharmacy 11) 1 hour GTT and ultrasound for dating in 1-2 weeks.  Farrel Connersolleen Massie Mees, CNM

## 2018-07-19 ENCOUNTER — Telehealth: Payer: Self-pay | Admitting: Certified Nurse Midwife

## 2018-07-19 ENCOUNTER — Other Ambulatory Visit: Payer: Self-pay | Admitting: Certified Nurse Midwife

## 2018-07-19 LAB — CYTOLOGY - PAP
Chlamydia: POSITIVE — AB
Diagnosis: NEGATIVE
Neisseria Gonorrhea: NEGATIVE
Trichomonas: NEGATIVE

## 2018-07-19 MED ORDER — AZITHROMYCIN 500 MG PO TABS: ORAL_TABLET | ORAL | 0 refills | Status: DC

## 2018-07-19 NOTE — Telephone Encounter (Signed)
Called [place] with results of Pap smear and cultures. Pap normal. Chlamydia was positive. Discussed treatment for self and partner with Azithromycin. RX sent to pharmacy. Advised to wait one week from treatment before resuming intercourse. Farrel Conners, CNM

## 2018-07-22 LAB — HEMOGLOBINOPATHY EVALUATION
HGB C: 0 %
HGB S: 0 %
HGB VARIANT: 0 %
Hemoglobin A2 Quantitation: 2.5 % (ref 1.8–3.2)
Hemoglobin F Quantitation: 0 % (ref 0.0–2.0)
Hgb A: 97.5 % (ref 96.4–98.8)

## 2018-07-22 LAB — RPR+RH+ABO+RUB AB+AB SCR+CB...
Antibody Screen: NEGATIVE
HIV Screen 4th Generation wRfx: NONREACTIVE
Hematocrit: 38.4 % (ref 34.0–46.6)
Hemoglobin: 12.8 g/dL (ref 11.1–15.9)
Hepatitis B Surface Ag: NEGATIVE
MCH: 27.6 pg (ref 26.6–33.0)
MCHC: 33.3 g/dL (ref 31.5–35.7)
MCV: 83 fL (ref 79–97)
Platelets: 345 10*3/uL (ref 150–450)
RBC: 4.63 x10E6/uL (ref 3.77–5.28)
RDW: 14.4 % (ref 11.7–15.4)
RPR Ser Ql: NONREACTIVE
Rh Factor: POSITIVE
Rubella Antibodies, IGG: 1.84 index (ref >0.99)
Varicella zoster IgG: 1856 index (ref >165)
WBC: 11.3 10*3/uL — ABNORMAL HIGH (ref 3.4–10.8)

## 2018-07-30 ENCOUNTER — Other Ambulatory Visit: Payer: Medicaid Other

## 2018-07-30 ENCOUNTER — Encounter: Payer: Medicaid Other | Admitting: Maternal Newborn

## 2018-08-13 ENCOUNTER — Ambulatory Visit (INDEPENDENT_AMBULATORY_CARE_PROVIDER_SITE_OTHER): Payer: Medicaid Other

## 2018-08-13 ENCOUNTER — Encounter: Payer: Self-pay | Admitting: Maternal Newborn

## 2018-08-13 ENCOUNTER — Other Ambulatory Visit: Payer: Medicaid Other

## 2018-08-13 ENCOUNTER — Ambulatory Visit (INDEPENDENT_AMBULATORY_CARE_PROVIDER_SITE_OTHER): Payer: Medicaid Other | Admitting: Maternal Newborn

## 2018-08-13 ENCOUNTER — Other Ambulatory Visit: Payer: Self-pay

## 2018-08-13 VITALS — BP 120/80 | Wt 261.0 lb

## 2018-08-13 DIAGNOSIS — Z3689 Encounter for other specified antenatal screening: Secondary | ICD-10-CM

## 2018-08-13 DIAGNOSIS — O09292 Supervision of pregnancy with other poor reproductive or obstetric history, second trimester: Secondary | ICD-10-CM

## 2018-08-13 DIAGNOSIS — O99210 Obesity complicating pregnancy, unspecified trimester: Secondary | ICD-10-CM | POA: Diagnosis not present

## 2018-08-13 DIAGNOSIS — Z3A20 20 weeks gestation of pregnancy: Secondary | ICD-10-CM

## 2018-08-13 DIAGNOSIS — O0990 Supervision of high risk pregnancy, unspecified, unspecified trimester: Secondary | ICD-10-CM

## 2018-08-13 DIAGNOSIS — E669 Obesity, unspecified: Secondary | ICD-10-CM | POA: Diagnosis not present

## 2018-08-13 DIAGNOSIS — Z363 Encounter for antenatal screening for malformations: Secondary | ICD-10-CM | POA: Diagnosis not present

## 2018-08-13 DIAGNOSIS — Z3687 Encounter for antenatal screening for uncertain dates: Secondary | ICD-10-CM

## 2018-08-13 NOTE — Progress Notes (Signed)
    Routine Prenatal Care Visit  Subjective  Donna David is a 28 y.o. G2P1001 at [redacted]w[redacted]d being seen today for ongoing prenatal care.  She is currently monitored for the following issues for this high-risk pregnancy and has Supervision of high risk pregnancy, antepartum; Obesity in pregnancy; Obesity (BMI 35.0-39.9 without comorbidity); and History of third degree perineal laceration on their problem list.  ----------------------------------------------------------------------------------- Patient reports heartburn.   Contractions: Not present. Vag. Bleeding: None.  Movement: Present. No leaking of fluid.  ----------------------------------------------------------------------------------- The following portions of the patient's history were reviewed and updated as appropriate: allergies, current medications, past family history, past medical history, past social history, past surgical history and problem list. Problem list updated.   Objective  Blood pressure 120/80, weight 261 lb (118.4 kg), last menstrual period 03/25/2018. Pregravid weight 257 lb (116.6 kg) Total Weight Gain 4 lb (1.814 kg)  Fetal Status: Fetal Heart Rate (bpm): 162   Movement: Present     General:  Alert, oriented and cooperative. Patient is in no acute distress.  Skin: Skin is warm and dry. No rash noted.   Cardiovascular: Normal heart rate noted  Respiratory: Normal respiratory effort, no problems with respiration noted  Abdomen: Soft, gravid, appropriate for gestational age. Pain/Pressure: Absent     Pelvic:  Cervical exam deferred        Extremities: Normal range of motion.  Edema: None  Mental Status: Normal mood and affect. Normal behavior. Normal judgment and thought content.     Assessment   28 y.o. G2P1001 at [redacted]w[redacted]d, EDD 12/30/2018 by Last Menstrual Period presenting for a routine prenatal visit.  Plan   pregnancy 2 Problems (from 03/25/18 to present)    No problems associated with this episode.     Anatomy scan done today, incomplete for multiple structures but normal anatomy in the visualized structures; female. Repeat at next visit.  Gestational age on today's ultrasound is [redacted]w[redacted]d. Dating changed as per ACOG Guidelines; new EDD is 01/11/2019  Discussed OTC options for heartburn relief.  Discussed visitation and waterbirth policies at the hospital with COVID-19 restrictions.  Please refer to After Visit Summary for other counseling recommendations.   Return in about 4 weeks (around 09/10/2018) for ROB and follow up anatomy scan.  Avel Sensor, CNM 08/13/2018  5:02 PM

## 2018-08-13 NOTE — Patient Instructions (Signed)

## 2018-08-14 LAB — GLUCOSE, 1 HOUR GESTATIONAL: Gestational Diabetes Screen: 140 mg/dL — ABNORMAL HIGH (ref 65–139)

## 2018-08-19 ENCOUNTER — Telehealth: Payer: Self-pay | Admitting: Certified Nurse Midwife

## 2018-08-19 DIAGNOSIS — R7309 Other abnormal glucose: Secondary | ICD-10-CM

## 2018-08-19 NOTE — Telephone Encounter (Signed)
Patient called with 1 hour GTT result: 140. Needs 3 hour GTT scheduled. Will have front office call to schedule. Dalia Heading, CNM

## 2018-09-10 ENCOUNTER — Ambulatory Visit (INDEPENDENT_AMBULATORY_CARE_PROVIDER_SITE_OTHER): Payer: Medicaid Other | Admitting: Obstetrics and Gynecology

## 2018-09-10 ENCOUNTER — Ambulatory Visit (INDEPENDENT_AMBULATORY_CARE_PROVIDER_SITE_OTHER): Payer: Medicaid Other

## 2018-09-10 ENCOUNTER — Other Ambulatory Visit: Payer: Self-pay

## 2018-09-10 VITALS — BP 122/87 | Wt 258.0 lb

## 2018-09-10 DIAGNOSIS — A749 Chlamydial infection, unspecified: Secondary | ICD-10-CM

## 2018-09-10 DIAGNOSIS — O98812 Other maternal infectious and parasitic diseases complicating pregnancy, second trimester: Secondary | ICD-10-CM

## 2018-09-10 DIAGNOSIS — Z362 Encounter for other antenatal screening follow-up: Secondary | ICD-10-CM

## 2018-09-10 DIAGNOSIS — Z3689 Encounter for other specified antenatal screening: Secondary | ICD-10-CM

## 2018-09-10 DIAGNOSIS — O99210 Obesity complicating pregnancy, unspecified trimester: Secondary | ICD-10-CM

## 2018-09-10 DIAGNOSIS — O99212 Obesity complicating pregnancy, second trimester: Secondary | ICD-10-CM

## 2018-09-10 DIAGNOSIS — O0990 Supervision of high risk pregnancy, unspecified, unspecified trimester: Secondary | ICD-10-CM

## 2018-09-10 DIAGNOSIS — Z8759 Personal history of other complications of pregnancy, childbirth and the puerperium: Secondary | ICD-10-CM

## 2018-09-10 DIAGNOSIS — R7309 Other abnormal glucose: Secondary | ICD-10-CM

## 2018-09-10 DIAGNOSIS — O0992 Supervision of high risk pregnancy, unspecified, second trimester: Secondary | ICD-10-CM

## 2018-09-10 DIAGNOSIS — O09292 Supervision of pregnancy with other poor reproductive or obstetric history, second trimester: Secondary | ICD-10-CM

## 2018-09-10 DIAGNOSIS — Z3A22 22 weeks gestation of pregnancy: Secondary | ICD-10-CM

## 2018-09-10 NOTE — Progress Notes (Signed)
Routine Prenatal Care Visit  Subjective  Donna RangerMorgan David is a 28 y.o. G2P1001 at 256w3d being seen today for ongoing prenatal care.  She is currently monitored for the following issues for this high-risk pregnancy and has Supervision of high risk pregnancy, antepartum; Obesity in pregnancy; Obesity (BMI 35.0-39.9 without comorbidity); History of third degree perineal laceration; Chlamydia infection complicating pregnancy; and Abnormal oral glucose tolerance test on their problem list.  ----------------------------------------------------------------------------------- Patient reports no complaints.   Contractions: Not present. Vag. Bleeding: None.  Movement: Present. Denies leaking of fluid.  ----------------------------------------------------------------------------------- The following portions of the patient's history were reviewed and updated as appropriate: allergies, current medications, past family history, past medical history, past social history, past surgical history and problem list. Problem list updated.   Objective  Blood pressure 122/87, weight 258 lb (117 kg), last menstrual period 03/25/2018. Pregravid weight 257 lb (116.6 kg) Total Weight Gain 1 lb (0.454 kg) Urinalysis:      Fetal Status: Fetal Heart Rate (bpm): 160   Movement: Present     General:  Alert, oriented and cooperative. Patient is in no acute distress.  Skin: Skin is warm and dry. No rash noted.   Cardiovascular: Normal heart rate noted  Respiratory: Normal respiratory effort, no problems with respiration noted  Abdomen: Soft, gravid, appropriate for gestational age. Pain/Pressure: Absent     Pelvic:  Cervical exam deferred        Extremities: Normal range of motion.     ental Status: Normal mood and affect. Normal behavior. Normal judgment and thought content.   [place] Ob Comp + 14 Wk  Result Date: 08/15/2018 Patient Name: Donna David DOB: [birth year] MRN: [id number] ULTRASOUND REPORT Location: [facility]  Date of Service: 08/13/2018 Indications:Anatomy Ultrasound Findings: Mason JimSingleton intrauterine pregnancy is visualized with FHR at 162 BPM. Biometrics give an (U/S) Gestational age of 4613w3d and an (U/S) EDD of 01/11/2019; this does not correlates with the clinically established Estimated Date of Delivery: 12/30/18 Fetal presentation is Cephalic. EFW: 253g ( 9 oz ). Placenta: posterior. Grade: 0 AFI: subjectively normal. Anatomic survey is incomplete for cervcial spine, 4CH, ductal arch, 3VV, nose/lips, and all strucutres in the brain and normal; Gender - female.  Impression: 1. 8513w3d Viable Singleton Intrauterine pregnancy by U/S. 2. (U/S) EDD is not consistent with Clinically established Estimated Date of Delivery: 12/30/18 . 3. Incomplete anatomy scan for the structures mentioned above. Recommendations: 1.Clinical correlation with the patient's History and Physical Exam. Deanna ArtisElyse S [place], RT Review of ULTRASOUND. I have personally reviewed images and [facility]. There is a singleton gestation with subjectively normal amniotic fluid volume. The fetal biometry correlates with established dating. Detailed evaluation of the fetal anatomy was performed.The fetal anatomical survey appears within normal limits within the resolution of ultrasound as described above.  It must be noted that a normal ultrasound is unable to rule out fetal aneuploidy.  Annamarie MajorPaul Harris, MD, Merlinda FrederickFACOG [facility], [facility] 08/15/2018  1:45 PM   [place] Ob Follow Up  Result Date: 09/10/2018 Patient Name: Donna David DOB: [birth year] MRN: [id number] ULTRASOUND REPORT Location: [facility] Date of Service: 09/10/2018 Indications:Anatomy Follow up ultrasound Findings: Mason JimSingleton intrauterine pregnancy is visualized with FHR at 162 BPM. Fetal presentation is Cephalic. Placenta: posterior. Grade: 0 AFI: subjectively normal. Anatomic survey is complete and normal.  The cervical spine, 4CH, DA, 3VV, nose/lips  and the brain were visualized today. Impression: 1. 376w3d Viable Singleton Intrauterine pregnancy by U/S. 2. (U/S) EDD  is consistent with Clinically established Estimated Date of Delivery: 01/11/19 . 3. Normal Anatomy Scan Recommendations: 1.Clinical correlation with the patient's History and Physical Exam. Gweneth Dimitri, RT There is a singleton gestation with subjectively normal amniotic fluid volume.  Limited evaluation of the fetal anatomy was performed today, focusing on on anatomic structures not fully visualized at the time of prior study.The visualized fetal anatomical survey appears within normal limits within the resolution of ultrasound as described above, and the anatomic survey is now complete.  It must be noted that a normal ultrasound is unable to rule out fetal aneuploidy, subtle defects such as small ASD or VDS may also not be visible on imaging. Malachy Mood, MD, [facility], [facility] 09/10/2018, 3:05 PM     Assessment   28 y.o. G2P1001 at [redacted]w[redacted]d by  01/11/2019, by Ultrasound presenting for routine prenatal visit  Plan   pregnancy 2 Problems (from 03/25/18 to present)    Problem Noted Resolved   Chlamydia infection complicating pregnancy 07/02/8020 by Malachy Mood, MD No   Supervision of high risk pregnancy, antepartum 07/16/2018 by Dalia Heading, CNM No   Overview Addendum 08/13/2018  3:54 PM by Rexene Agent, [place] Prenatal Labs  Dating 18 week [place] Blood type: O/Positive/-- (06/02 1146)   Genetic Screen 1 Screen:    AFP:     Quad:     NIPS: Antibody:Negative (06/02 1146)  Anatomic [place] Complete Rubella: 1.84 (06/02 1146) Varicella: Immune  GTT Early:  140             Third trimester:  RPR: Non Reactive (06/02 1146)   Rhogam  HBsAg: Negative (06/02 1146)   TDaP vaccine                       Flu Shot: HIV: Non Reactive (06/02 1146)   Baby Food                                GBS:   Contraception  Pap: 07/16/2018, NILM   CBB     CS/VBAC    Support Person                  Gestational age appropriate obstetric precautions including but not limited to vaginal bleeding, contractions, leaking of fluid and fetal movement were reviewed in detail with the patient.    - anatomy scan complete - TOC chlamydia  Return in about 4 weeks (around 10/08/2018), or NEEDS 3-hr OGTT test scheduled ASAP, ROB phone visit 4 weeks, ROB and 28 week labs in 6 weeks.  Malachy Mood, MD, [facility], [facility] 09/10/2018, 3:19 PM

## 2018-09-10 NOTE — Progress Notes (Signed)
No vb. No lof. Follow up u/s today

## 2018-09-14 LAB — GC/CHLAMYDIA PROBE AMP
Chlamydia trachomatis, NAA: NEGATIVE
Neisseria Gonorrhoeae by PCR: NEGATIVE

## 2018-09-19 ENCOUNTER — Other Ambulatory Visit: Payer: Medicaid Other

## 2018-10-09 ENCOUNTER — Other Ambulatory Visit: Payer: Self-pay

## 2018-10-09 ENCOUNTER — Ambulatory Visit (INDEPENDENT_AMBULATORY_CARE_PROVIDER_SITE_OTHER): Payer: Medicaid Other | Admitting: Advanced Practice Midwife

## 2018-10-09 ENCOUNTER — Encounter: Payer: Self-pay | Admitting: Advanced Practice Midwife

## 2018-10-09 DIAGNOSIS — O09292 Supervision of pregnancy with other poor reproductive or obstetric history, second trimester: Secondary | ICD-10-CM | POA: Diagnosis not present

## 2018-10-09 DIAGNOSIS — Z3A26 26 weeks gestation of pregnancy: Secondary | ICD-10-CM | POA: Diagnosis not present

## 2018-10-09 NOTE — Progress Notes (Signed)
Routine Prenatal Care Visit- Virtual Visit  Subjective   Virtual Visit via Telephone Note  I connected with Donna David on 10/09/18 at  3:30 PM EDT by telephone and verified that I am speaking with the correct person using two identifiers.   I discussed the limitations, risks, security and privacy concerns of performing an evaluation and management service by telephone and the availability of in person appointments. I also discussed with the patient that there may be a patient responsible charge related to this service. The patient expressed understanding and agreed to proceed.  The patient was at home  I spoke with the patient from my  Office phone The names of people involved in this encounter were: Donna David and myself Rod Can, CNM   David Donna is a 28 y.o. G2P1001 at [redacted]w[redacted]d being seen today for ongoing prenatal care.  She is currently monitored for the following issues for this high-risk pregnancy and has Supervision of high risk pregnancy, antepartum; Obesity in pregnancy; Obesity (BMI 35.0-39.9 without comorbidity); History of third degree perineal laceration; Chlamydia infection complicating pregnancy; and Abnormal oral glucose tolerance test on their problem list.  ----------------------------------------------------------------------------------- Patient reports good fetal movement and ongoing nausea.   Contractions: Not present. Vag. Bleeding: None.  Movement: Present. Denies leaking of fluid.  ----------------------------------------------------------------------------------- The following portions of the patient's history were reviewed and updated as appropriate: allergies, current medications, past family history, past medical history, past social history, past surgical history and problem list. Problem list updated.   Objective  Last menstrual period 03/25/2018. Pregravid weight 257 lb (116.6 kg) Total Weight Gain 1 lb (0.454 kg) Urinalysis:      Fetal  Status:     Movement: Present     Physical Exam could not be performed. Because of the COVID-19 outbreak this visit was performed over the phone and not in person.   Assessment   28 y.o. G2P1001 at [redacted]w[redacted]d by  01/11/2019, by Ultrasound presenting for routine prenatal visit  Plan   pregnancy 2 Problems (from 03/25/18 to present)    Problem Noted Resolved   Chlamydia infection complicating pregnancy 9/37/9024 by Malachy Mood, MD No   Abnormal oral glucose tolerance test 09/10/2018 by Malachy Mood, MD No   Supervision of high risk pregnancy, antepartum 07/16/2018 by Dalia Heading, CNM No   Overview Addendum 09/10/2018  3:18 PM by Malachy Mood, MD    Clinic [facility] Prenatal Labs  Dating 18 week [place] Blood type: O/Positive/-- (06/02 1146)   Genetic Screen 1 Screen:    AFP:     Quad:     NIPS: Antibody:Negative (06/02 1146)  Anatomic [place] Complete Rubella: 1.84 (06/02 1146) Varicella: Immune  GTT Early: 140  Third trimester:  RPR: Non Reactive (06/02 1146)   Rhogam  HBsAg: Negative (06/02 1146)   TDaP vaccine                       Flu Shot: HIV: Non Reactive (06/02 1146)   Baby Food                                GBS:   Contraception  Pap: 07/16/2018, NILM  CBB     CS/VBAC    Support Person                  Gestational age appropriate obstetric precautions including but not limited to vaginal bleeding, contractions, leaking of  fluid and fetal movement were reviewed in detail with the patient.     Follow Up Instructions: Nausea: OTC Unisom plus B6, Sea Bands   I discussed the assessment and treatment plan with the patient. The patient was provided an opportunity to ask questions and all were answered. The patient agreed with the plan and demonstrated an understanding of the instructions.   The patient was advised to call back or seek an in-person evaluation if the symptoms worsen or if the condition fails to improve as anticipated.  I provided 10 minutes of  non-face-to-face time during this encounter.  Return in about 2 weeks (around 10/23/2018) for rob.   Donna David, CNM [facility] 10/09/2018, 4:11 PM

## 2018-10-09 NOTE — Progress Notes (Signed)
No problems.rj 

## 2018-10-11 ENCOUNTER — Other Ambulatory Visit: Payer: Medicaid Other

## 2018-10-12 ENCOUNTER — Encounter: Payer: Self-pay | Admitting: Emergency Medicine

## 2018-10-12 ENCOUNTER — Other Ambulatory Visit: Payer: Self-pay

## 2018-10-12 ENCOUNTER — Emergency Department
Admission: EM | Admit: 2018-10-12 | Discharge: 2018-10-12 | Disposition: A | Discharge status: Home / Self Care | Payer: Medicaid Other | Attending: Emergency Medicine | Admitting: Emergency Medicine

## 2018-10-12 DIAGNOSIS — R1110 Vomiting, unspecified: Secondary | ICD-10-CM | POA: Diagnosis not present

## 2018-10-12 DIAGNOSIS — Z5321 Procedure and treatment not carried out due to patient leaving prior to being seen by health care provider: Secondary | ICD-10-CM | POA: Diagnosis not present

## 2018-10-12 LAB — LIPASE, BLOOD: Lipase: 25 U/L (ref 11–51)

## 2018-10-12 LAB — CBC WITH DIFFERENTIAL/PLATELET
Abs Immature Granulocytes: 0.05 10*3/uL (ref 0.00–0.07)
Basophils Absolute: 0 10*3/uL (ref 0.0–0.1)
Basophils Relative: 0 %
Eosinophils Absolute: 0.1 10*3/uL (ref 0.0–0.5)
Eosinophils Relative: 1 %
HCT: 38.6 % (ref 36.0–46.0)
Hemoglobin: 12.6 g/dL (ref 12.0–15.0)
Immature Granulocytes: 1 %
Lymphocytes Relative: 15 %
Lymphs Abs: 1.5 10*3/uL (ref 0.7–4.0)
MCH: 27.7 pg (ref 26.0–34.0)
MCHC: 32.6 g/dL (ref 30.0–36.0)
MCV: 84.8 fL (ref 80.0–100.0)
Monocytes Absolute: 0.6 10*3/uL (ref 0.1–1.0)
Monocytes Relative: 6 %
Neutro Abs: 8.4 10*3/uL — ABNORMAL HIGH (ref 1.7–7.7)
Neutrophils Relative %: 77 %
Platelets: 365 10*3/uL (ref 150–400)
RBC: 4.55 MIL/uL (ref 3.87–5.11)
RDW: 13.4 % (ref 11.5–15.5)
WBC: 10.7 10*3/uL — ABNORMAL HIGH (ref 4.0–10.5)
nRBC: 0 % (ref 0.0–0.2)

## 2018-10-12 LAB — COMPREHENSIVE METABOLIC PANEL
ALT: 9 U/L (ref 0–44)
AST: 10 U/L — ABNORMAL LOW (ref 15–41)
Albumin: 3.2 g/dL — ABNORMAL LOW (ref 3.5–5.0)
Alkaline Phosphatase: 57 U/L (ref 38–126)
Anion gap: 9 (ref 5–15)
BUN: <5 mg/dL — ABNORMAL LOW (ref 6–20)
CO2: 20 mmol/L — ABNORMAL LOW (ref 22–32)
Calcium: 9.2 mg/dL (ref 8.9–10.3)
Chloride: 106 mmol/L (ref 98–111)
Creatinine, Ser: 0.46 mg/dL (ref 0.44–1.00)
GFR calc Af Amer: >60 mL/min (ref >60)
GFR calc non Af Amer: >60 mL/min (ref >60)
Glucose, Bld: 89 mg/dL (ref 70–99)
Potassium: 3.6 mmol/L (ref 3.5–5.1)
Sodium: 135 mmol/L (ref 135–145)
Total Bilirubin: 0.2 mg/dL — ABNORMAL LOW (ref 0.3–1.2)
Total Protein: 7.1 g/dL (ref 6.5–8.1)

## 2018-10-12 NOTE — ED Notes (Signed)
Patient sitting in lobby eating chick fil a.

## 2018-10-12 NOTE — ED Notes (Signed)
Pt states she did not want to wait to be treated, pt states she wants to go home a lay down. Pt states she does feel ok at this time. Encouraged pt to follow up with OB-GYN. Also informed patient to return to ED if she feels worse.

## 2018-10-12 NOTE — ED Triage Notes (Signed)
States has vomiting every morning. [redacted] weeks pregnant with no vaginal fluid, contractions or other labor symptoms. States saw blood in vomit this am. Emesis was primarily bile looking.

## 2018-10-22 ENCOUNTER — Other Ambulatory Visit: Payer: Medicaid Other

## 2018-10-22 ENCOUNTER — Encounter: Payer: Medicaid Other | Admitting: Obstetrics and Gynecology

## 2018-10-31 ENCOUNTER — Other Ambulatory Visit: Payer: Self-pay

## 2018-10-31 ENCOUNTER — Ambulatory Visit (INDEPENDENT_AMBULATORY_CARE_PROVIDER_SITE_OTHER): Payer: Medicaid Other | Admitting: Certified Nurse Midwife

## 2018-10-31 ENCOUNTER — Other Ambulatory Visit: Payer: Medicaid Other

## 2018-10-31 VITALS — BP 110/64 | Wt 253.0 lb

## 2018-10-31 DIAGNOSIS — O99210 Obesity complicating pregnancy, unspecified trimester: Secondary | ICD-10-CM

## 2018-10-31 DIAGNOSIS — O99213 Obesity complicating pregnancy, third trimester: Secondary | ICD-10-CM

## 2018-10-31 DIAGNOSIS — O0990 Supervision of high risk pregnancy, unspecified, unspecified trimester: Secondary | ICD-10-CM | POA: Diagnosis not present

## 2018-10-31 DIAGNOSIS — Z3A29 29 weeks gestation of pregnancy: Secondary | ICD-10-CM

## 2018-10-31 DIAGNOSIS — O0993 Supervision of high risk pregnancy, unspecified, third trimester: Secondary | ICD-10-CM

## 2018-10-31 DIAGNOSIS — O2613 Low weight gain in pregnancy, third trimester: Secondary | ICD-10-CM

## 2018-10-31 LAB — POCT URINALYSIS DIPSTICK OB
Glucose, UA: NEGATIVE
POC,PROTEIN,UA: NEGATIVE

## 2018-10-31 NOTE — Progress Notes (Signed)
C/o continues to steadily lose weight - wants to make sure baby is gaining like it's supposed to.rj

## 2018-11-01 ENCOUNTER — Encounter: Payer: Self-pay | Admitting: Certified Nurse Midwife

## 2018-11-01 LAB — 28 WEEK RH+PANEL
Basophils Absolute: 0 10*3/uL (ref 0.0–0.2)
Basos: 0 %
EOS (ABSOLUTE): 0.1 10*3/uL (ref 0.0–0.4)
Eos: 1 %
Gestational Diabetes Screen: 160 mg/dL — ABNORMAL HIGH (ref 65–139)
HIV Screen 4th Generation wRfx: NONREACTIVE
Hematocrit: 39.1 % (ref 34.0–46.6)
Hemoglobin: 13.1 g/dL (ref 11.1–15.9)
Immature Grans (Abs): 0 10*3/uL (ref 0.0–0.1)
Immature Granulocytes: 0 %
Lymphocytes Absolute: 1.2 10*3/uL (ref 0.7–3.1)
Lymphs: 11 %
MCH: 27.6 pg (ref 26.6–33.0)
MCHC: 33.5 g/dL (ref 31.5–35.7)
MCV: 83 fL (ref 79–97)
Monocytes Absolute: 0.4 10*3/uL (ref 0.1–0.9)
Monocytes: 4 %
Neutrophils Absolute: 8.6 10*3/uL — ABNORMAL HIGH (ref 1.4–7.0)
Neutrophils: 84 %
Platelets: 388 10*3/uL (ref 150–450)
RBC: 4.74 x10E6/uL (ref 3.77–5.28)
RDW: 13.4 % (ref 11.7–15.4)
RPR Ser Ql: NONREACTIVE
WBC: 10.2 10*3/uL (ref 3.4–10.8)

## 2018-11-01 MED ORDER — FAMOTIDINE 20 MG PO TABS: 20.0000 mg | ORAL_TABLET | Freq: Two times a day (BID) | ORAL | 3 refills | Status: DC

## 2018-11-01 NOTE — Progress Notes (Signed)
ROB at 29wk5d: Has lost 5 more pounds. Having problems with heartburn and reflux.TWG is a net loss of 4#.  Will try Pepcid 20 mgm BID Hx of third degree laceration with G1.Baby was  6#10 oz. No VAD or forceps. Patient states she pushed really hard and forcefully. No problems with healing. Offered primary Cesarean section with this delivery and she declines. We discussed pushing slowly to deliver baby and the role of perineal massage to try to prevent third degree extensions  FH 32 cm and FHTs WNL Wants to breast feed. Given Ready Set Baby info Not enough urine specimen to get urine culture and UDS Get at next visit in 2 weeks as well ultrasound for growth  Dalia Heading, CNM

## 2018-11-05 ENCOUNTER — Other Ambulatory Visit: Payer: Self-pay | Admitting: Obstetrics and Gynecology

## 2018-11-05 DIAGNOSIS — R7309 Other abnormal glucose: Secondary | ICD-10-CM

## 2018-11-05 DIAGNOSIS — O0990 Supervision of high risk pregnancy, unspecified, unspecified trimester: Secondary | ICD-10-CM

## 2018-11-05 DIAGNOSIS — O99210 Obesity complicating pregnancy, unspecified trimester: Secondary | ICD-10-CM

## 2018-11-05 LAB — URINE DRUG PANEL 7
Amphetamines, Urine: NEGATIVE ng/mL
Barbiturate Quant, Ur: NEGATIVE ng/mL
Benzodiazepine Quant, Ur: NEGATIVE ng/mL
Cannabinoid Quant, Ur: POSITIVE — AB
Cocaine (Metab.): NEGATIVE ng/mL
Opiate Quant, Ur: NEGATIVE ng/mL
PCP Quant, Ur: NEGATIVE ng/mL

## 2018-11-05 NOTE — Progress Notes (Signed)
3-hr OGTT scheduled

## 2018-11-14 ENCOUNTER — Other Ambulatory Visit: Payer: Medicaid Other

## 2018-11-15 ENCOUNTER — Other Ambulatory Visit: Payer: Self-pay

## 2018-11-15 ENCOUNTER — Ambulatory Visit (INDEPENDENT_AMBULATORY_CARE_PROVIDER_SITE_OTHER): Payer: Medicaid Other

## 2018-11-15 ENCOUNTER — Encounter: Payer: Self-pay | Admitting: Advanced Practice Midwife

## 2018-11-15 ENCOUNTER — Other Ambulatory Visit: Payer: Self-pay | Admitting: Advanced Practice Midwife

## 2018-11-15 ENCOUNTER — Ambulatory Visit (INDEPENDENT_AMBULATORY_CARE_PROVIDER_SITE_OTHER): Payer: Medicaid Other | Admitting: Advanced Practice Midwife

## 2018-11-15 VITALS — BP 108/62 | Wt 248.0 lb

## 2018-11-15 DIAGNOSIS — O99210 Obesity complicating pregnancy, unspecified trimester: Secondary | ICD-10-CM

## 2018-11-15 DIAGNOSIS — Z3A30 30 weeks gestation of pregnancy: Secondary | ICD-10-CM

## 2018-11-15 DIAGNOSIS — O0993 Supervision of high risk pregnancy, unspecified, third trimester: Secondary | ICD-10-CM

## 2018-11-15 DIAGNOSIS — Z3A31 31 weeks gestation of pregnancy: Secondary | ICD-10-CM | POA: Diagnosis not present

## 2018-11-15 DIAGNOSIS — O2613 Low weight gain in pregnancy, third trimester: Secondary | ICD-10-CM

## 2018-11-15 DIAGNOSIS — O0990 Supervision of high risk pregnancy, unspecified, unspecified trimester: Secondary | ICD-10-CM

## 2018-11-15 DIAGNOSIS — O365930 Maternal care for other known or suspected poor fetal growth, third trimester, not applicable or unspecified: Secondary | ICD-10-CM

## 2018-11-15 DIAGNOSIS — O99213 Obesity complicating pregnancy, third trimester: Secondary | ICD-10-CM

## 2018-11-15 DIAGNOSIS — O98812 Other maternal infectious and parasitic diseases complicating pregnancy, second trimester: Secondary | ICD-10-CM

## 2018-11-15 NOTE — Progress Notes (Signed)
ROB  Growth scan 

## 2018-11-15 NOTE — Progress Notes (Signed)
Routine Prenatal Care Visit  Subjective  Donna David is a 28 y.o. G2P1001 at [redacted]w[redacted]d being seen today for ongoing prenatal care.  She is currently monitored for the following issues for this high-risk pregnancy and has Supervision of high risk pregnancy, antepartum; Obesity in pregnancy; Obesity (BMI 35.0-39.9 without comorbidity); History of third degree perineal laceration; Chlamydia infection complicating pregnancy; and Abnormal oral glucose tolerance test on their problem list.  ----------------------------------------------------------------------------------- Patient reports ongoing nausea. She uses marijuana 3x/w. Encouraged cessation. She has not yet scheduled her 3 hr gtt and doesn't think she can tolerate the drink or the procedure. Discussed then going right into GDM monitoring/education/etc. She was not interested in that either during the visit. She was leaning more towards scheduling the 3 hr gtt. Will need to f/u on that. Discussed growth scan today- 21% overall and <2.3% AC. Dopplers and BPP normal.   Contractions: Not present. Vag. Bleeding: None.  Movement: Present. Leaking Fluid denies.  ----------------------------------------------------------------------------------- The following portions of the patient's history were reviewed and updated as appropriate: allergies, current medications, past family history, past medical history, past social history, past surgical history and problem list. Problem list updated.  Objective  Blood pressure 108/62, weight 248 lb (112.5 kg), last menstrual period 03/25/2018. Pregravid weight 257 lb (116.6 kg) Total Weight Gain -9 lb (-4.082 kg) Urinalysis: Urine Protein    Urine Glucose    Fetal Status: Fetal Heart Rate (bpm): 148   Movement: Present     Patient Name: Donna David DOB: May 30, 1990 MRN: [id number]  ULTRASOUND REPORT  Location: [facility] Date of Service: 11/15/2018   Indications:growth/afi, BPP and u/a dopple   Findings:   Mason Jim intrauterine pregnancy is visualized with FHR at 148 BPM. Biometrics give an (U/S) Gestational age of [redacted]w[redacted]d and an (U/S) EDD of 01/18/2019; this correlates with the clinically established Estimated Date of Delivery: 01/11/19.  Fetal presentation is Cephalic.  Placenta: posterior. Grade: 1 AFI: 10.5 cm  Growth percentile is 21.1%. AC is less than 2.3% EFW: 1493 g  ( 3 lb 5 oz )  BPP Scoring: Movement: 2/2  Tone: 2/2  Breathing: 2/2  AFI: 2/2  Umbilical Artery Dopplers were performed due to [facility] less than 2.3 % Systolic and Diastolic blood flow in each umbilical artery appear normal and without reversal or absence of diastolic flow.   The maximum S/D ratio is 2.87.   According to [email or web address], this ratio is normal for this gestational age.   Impression: 1. [redacted]w[redacted]d Viable Singleton Intrauterine pregnancy previously established criteria. 2. Growth is 21.1 %ile.  AFI is 10.5 cm.  3. 8/8 on BPP 4. Normal umbilical artery dopplers  Recommendations: 1.Clinical correlation with the patient's History and Physical Exam.  Deanna Artis, RT  General:  Alert, oriented and cooperative. Patient is in no acute distress.  Skin: Skin is warm and dry. No rash noted.   Cardiovascular: Normal heart rate noted  Respiratory: Normal respiratory effort, no problems with respiration noted  Abdomen: Soft, gravid, appropriate for gestational age. Pain/Pressure: Absent     Pelvic:  Cervical exam deferred        Extremities: Normal range of motion.     Mental Status: Normal mood and affect. Normal behavior. Normal judgment and thought content.   Assessment   28 y.o. G2P1001 at [redacted]w[redacted]d by  01/11/2019, by Ultrasound presenting for routine prenatal visit  Plan   pregnancy 2 Problems (from 03/25/18 to present)    Problem Noted Resolved   Chlamydia infection complicating pregnancy 09/10/2018  by Malachy Mood, MD No   Abnormal oral glucose tolerance test  09/10/2018 by Malachy Mood, MD No   Overview Signed 11/05/2018  3:39 PM by Malachy Mood, MD    Early 1-hr 140, never did follow up 3-hr 28 week 1-hr 160      Supervision of high risk pregnancy, antepartum 07/16/2018 by Dalia Heading, CNM No   Overview Addendum 11/05/2018  3:39 PM by Malachy Mood, MD    Clinic [facility] Prenatal Labs  Dating 18 week [place] Blood type: O/Positive/-- (06/02 1146)   Genetic Screen 1 Screen:    AFP:     Quad:     NIPS: Antibody:Negative (06/02 1146)  Anatomic [place] Complete Rubella: 1.84 (06/02 1146) Varicella: Immune  GTT Early: 140  Third trimester: 160 RPR: Non Reactive (06/02 1146)   Rhogam  HBsAg: Negative (06/02 1146)   TDaP vaccine                       Flu Shot: HIV: Non Reactive (06/02 1146)   Baby Food                                GBS:   Contraception  Pap: 07/16/2018, NILM  CBB     CS/VBAC    Support Person                  Preterm labor symptoms and general obstetric precautions including but not limited to vaginal bleeding, contractions, leaking of fluid and fetal movement were reviewed in detail with the patient.    Return in about 2 weeks (around 11/29/2018) for rob- needs to schedule 3 hr gtt.  Rod Can, CNM 11/15/2018 2:55 PM

## 2018-11-17 LAB — URINE CULTURE

## 2018-11-20 DIAGNOSIS — Z5181 Encounter for therapeutic drug level monitoring: Secondary | ICD-10-CM | POA: Diagnosis not present

## 2018-11-29 ENCOUNTER — Encounter: Payer: Medicaid Other | Admitting: Advanced Practice Midwife

## 2018-12-04 ENCOUNTER — Ambulatory Visit (INDEPENDENT_AMBULATORY_CARE_PROVIDER_SITE_OTHER): Payer: Medicaid Other | Admitting: Obstetrics and Gynecology

## 2018-12-04 ENCOUNTER — Other Ambulatory Visit: Payer: Self-pay | Admitting: Obstetrics and Gynecology

## 2018-12-04 ENCOUNTER — Ambulatory Visit (INDEPENDENT_AMBULATORY_CARE_PROVIDER_SITE_OTHER): Payer: Medicaid Other

## 2018-12-04 ENCOUNTER — Encounter: Payer: Self-pay | Admitting: Obstetrics and Gynecology

## 2018-12-04 ENCOUNTER — Other Ambulatory Visit: Payer: Self-pay

## 2018-12-04 VITALS — BP 120/70 | Wt 256.0 lb

## 2018-12-04 DIAGNOSIS — Z362 Encounter for other antenatal screening follow-up: Secondary | ICD-10-CM | POA: Diagnosis not present

## 2018-12-04 DIAGNOSIS — O365930 Maternal care for other known or suspected poor fetal growth, third trimester, not applicable or unspecified: Secondary | ICD-10-CM | POA: Diagnosis not present

## 2018-12-04 DIAGNOSIS — O365990 Maternal care for other known or suspected poor fetal growth, unspecified trimester, not applicable or unspecified: Secondary | ICD-10-CM

## 2018-12-04 DIAGNOSIS — Z3A34 34 weeks gestation of pregnancy: Secondary | ICD-10-CM | POA: Diagnosis not present

## 2018-12-04 DIAGNOSIS — O99210 Obesity complicating pregnancy, unspecified trimester: Secondary | ICD-10-CM

## 2018-12-04 DIAGNOSIS — O99810 Abnormal glucose complicating pregnancy: Secondary | ICD-10-CM

## 2018-12-04 DIAGNOSIS — O99213 Obesity complicating pregnancy, third trimester: Secondary | ICD-10-CM

## 2018-12-04 DIAGNOSIS — O09293 Supervision of pregnancy with other poor reproductive or obstetric history, third trimester: Secondary | ICD-10-CM | POA: Diagnosis not present

## 2018-12-04 DIAGNOSIS — O0990 Supervision of high risk pregnancy, unspecified, unspecified trimester: Secondary | ICD-10-CM

## 2018-12-04 DIAGNOSIS — Z8759 Personal history of other complications of pregnancy, childbirth and the puerperium: Secondary | ICD-10-CM

## 2018-12-04 DIAGNOSIS — O0993 Supervision of high risk pregnancy, unspecified, third trimester: Secondary | ICD-10-CM

## 2018-12-04 MED ORDER — ACCU-CHEK FASTCLIX LANCETS MISC: 1.0000 [IU] | Freq: Four times a day (QID) | 12 refills | Status: DC

## 2018-12-04 MED ORDER — ACCU-CHEK NANO SMARTVIEW W/DEVICE KIT: 1.0000 | PACK | 0 refills | Status: DC

## 2018-12-04 MED ORDER — ACCU-CHEK GUIDE VI STRP: ORAL_STRIP | 12 refills | Status: DC

## 2018-12-04 MED ORDER — ACCU-CHEK GUIDE W/DEVICE KIT: 1.0000 | PACK | Freq: Four times a day (QID) | 0 refills | Status: DC

## 2018-12-04 MED ORDER — GLUCOSE BLOOD VI STRP: ORAL_STRIP | 12 refills | Status: DC

## 2018-12-04 NOTE — Addendum Note (Signed)
Addended by: Drenda Freeze on: 12/04/2018 05:25 PM   Modules accepted: Orders

## 2018-12-04 NOTE — Progress Notes (Signed)
Routine Prenatal Care Visit  Subjective  Donna David is a 28 y.o. G2P1001 at [redacted]w[redacted]d being seen today for ongoing prenatal care.  She is currently monitored for the following issues for this high-risk pregnancy and has Supervision of high risk pregnancy, antepartum; Obesity in pregnancy; Obesity (BMI 35.0-39.9 without comorbidity); History of third degree perineal laceration; Chlamydia infection complicating pregnancy; and Abnormal oral glucose tolerance test on their problem list.  ----------------------------------------------------------------------------------- Patient reports no complaints.   Contractions: Not present. Vag. Bleeding: None.  Movement: Present. Denies leaking of fluid.  ----------------------------------------------------------------------------------- The following portions of the patient's history were reviewed and updated as appropriate: allergies, current medications, past family history, past medical history, past social history, past surgical history and problem list. Problem list updated.   Objective  Blood pressure 120/70, weight 256 lb (116.1 kg), last menstrual period 03/25/2018. Pregravid weight 257 lb (116.6 kg) Total Weight Gain -1 lb (-0.454 kg) Urinalysis:      Fetal Status: Fetal Heart Rate (bpm): 135   Movement: Present     General:  Alert, oriented and cooperative. Patient is in no acute distress.  Skin: Skin is warm and dry. No rash noted.   Cardiovascular: Normal heart rate noted  Respiratory: Normal respiratory effort, no problems with respiration noted  Abdomen: Soft, gravid, appropriate for gestational age. Pain/Pressure: Absent     Pelvic:  Cervical exam deferred        Extremities: Normal range of motion.     Mental Status: Normal David and affect. Normal behavior. Normal judgment and thought content.   NST: 135 bpm baseline, moderate variability, 15x15 accelerations, no decelerations.  Assessment   28 y.o. G2P1001 at [redacted]w[redacted]d by   01/11/2019, by Ultrasound presenting for routine prenatal visit  Plan   pregnancy 2 Problems (from 03/25/18 to present)    Problem Noted Resolved   Chlamydia infection complicating pregnancy 7/42/5956 by Donna Mood, MD No   Abnormal oral glucose tolerance test 09/10/2018 by Donna Mood, MD No   Overview Signed 11/05/2018  3:39 PM by Donna Mood, MD    Early 1-hr 140, never did follow up 3-hr 28 week 1-hr 160      Supervision of high risk pregnancy, antepartum 07/16/2018 by Donna David, CNM No   Overview Addendum 11/05/2018  3:39 PM by Donna Mood, MD    Clinic [facility] Prenatal Labs  Dating 18 week [place] Blood type: O/Positive/-- (06/02 1146)   Genetic Screen 1 Screen:    AFP:     Quad:     NIPS: Antibody:Negative (06/02 1146)  Anatomic [place] Complete Rubella: 1.84 (06/02 1146) Varicella: Immune  GTT Early: 140  Third trimester: 160 RPR: Non Reactive (06/02 1146)   Rhogam  HBsAg: Negative (06/02 1146)   TDaP vaccine                       Flu Shot: HIV: Non Reactive (06/02 1146)   Baby Food                                GBS:   Contraception  Pap: 07/16/2018, NILM  CBB     CS/VBAC    Support Person                  Gestational age appropriate obstetric precautions including but not limited to vaginal bleeding, contractions, leaking of fluid and fetal movement were reviewed in detail with the  patient.    Patient will pick up glucometer and return with it today to learn to check blood glucose levels. NST reactive Will return for growth, AFI UA doppler today.   Discussed risks with patient of IUGR and diabetes including fetal death, neonatal hypoglycemia and possible NICU admission. Patient understood. Encouraged patient to participate in care.   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 She would like to try and time delivery Around beginning of November and asked about taking "midwife brew" to bring on labor- encouraged patient to NOT take this  to bring on labor. Delivery will be dependent on her pregnancy related complications  Return in about 1 week (around 12/11/2018) for [place], NST/ROB with MD, in person.  Donna Milch MD [facility], [facility] 12/04/2018, 3:38 PM

## 2018-12-04 NOTE — Patient Instructions (Signed)
Gestational Diabetes Mellitus, Diagnosis Gestational diabetes (gestational diabetes mellitus) is a short-term (temporary) form of diabetes that can happen during pregnancy. It goes away after you give birth. It may be caused by one or both of these problems:  Your pancreas does not make enough of a hormone called insulin.  Your body does not respond in a normal way to insulin that it makes. Insulin lets sugars (glucose) go into cells in the body. This gives you energy. If you have diabetes, sugars cannot get into cells. This causes high blood sugar (hyperglycemia). If you get gestational diabetes, you are:  More likely to get it if you get pregnant again.  More likely to develop type 2 diabetes in the future. If gestational diabetes is treated, it may not hurt you or your baby. Your doctor will set treatment goals for you. In general, you should have these blood sugar levels:  After not eating for a long time (fasting): 95 mg/dL (5.3 mmol/L).  After meals (postprandial): ? One hour after a meal: at or below 140 mg/dL (7.8 mmol/L). ? Two hours after a meal: at or below 120 mg/dL (6.7 mmol/L).  A1c (hemoglobin A1c) level: 6-6.5%. Follow these instructions at home: Questions to ask your doctor   You may want to ask these questions: ? Do I need to meet with a diabetes educator? ? What equipment will I need to care for myself at home? ? What medicines do I need? When should I take them? ? How often do I need to check my blood sugar? ? What number can I call if I have questions? ? When is my next doctor's visit? General instructions  Take over-the-counter and prescription medicines only as told by your doctor.  Stay at a healthy weight during pregnancy.  Keep all follow-up visits as told by your doctor. This is important. Contact a doctor if:  Your blood sugar is at or above 240 mg/dL (13.3 mmol/L).  Your blood sugar is at or above 200 mg/dL (11.1 mmol/L) and you have ketones in  your pee (urine).  You have been sick or have had a fever for 2 days or more and you are not getting better.  You have any of these problems for more than 6 hours: ? You cannot eat or drink. ? You feel sick to your stomach (nauseous). ? You throw up (vomit). ? You have watery poop (diarrhea). Get help right away if:  Your blood sugar is lower than 54 mg/dL (3 mmol/L).  You get confused.  You have trouble: ? Thinking clearly. ? Breathing.  Your baby moves less than normal.  You have any of these: ? Moderate or large ketone levels in your pee. ? Blood coming from your vagina. ? Unusual fluid coming from your vagina. ? Early contractions. These may feel like tightness in your belly. Summary  Gestational diabetes is a short-term form of diabetes. It can happen while you are pregnant. It goes away after you give birth.  If gestational diabetes is treated, it may not hurt you or your baby. Your doctor will set treatment goals for you.  Keep all follow-up visits as told by your doctor. This is important. This information is not intended to replace advice given to you by your health care provider. Make sure you discuss any questions you have with your health care provider. Document Released: 05/24/2015 Document Revised: 03/08/2017 Document Reviewed: 03/05/2015 Elsevier Patient Education  [place].  Intrauterine Growth Restriction  Intrauterine growth restriction (  IUGR) is when a baby is not growing normally during pregnancy. A baby with IUGR is smaller than it should be and may weigh less than normal at birth. IUGR can result from a problem with the organ that supplies the unborn baby (fetus) with oxygen and nutrition (placenta). Usually, there is no way to prevent this type of problem. Babies with IUGR are at higher risk for early delivery and needing special (intensive) care after birth. What are the causes? The most common cause of IUGR is a problem with the placenta or  umbilical cord that causes the fetus to get less oxygen or nutrition than needed. Other causes include:  The mother eating a very unhealthy diet (poor maternal nutrition).  Exposure to chemicals found in substances such as cigarettes, alcohol, and some drugs.  Some prescription medicines.  Other problems that develop in the womb (congenital birth defects).  Genetic disorders.  Infection.  Carrying more than one baby. What increases the risk? This condition is more likely to affect babies of mothers who:  Are over the age of 52 at the time of delivery.  Are younger than age 25 at the time of delivery.  Have medical conditions such as high blood pressure, preeclampsia, diabetes, heart or kidney disease, systemic lupus erythematosus, or anemia.  Live at a very high altitude during pregnancy.  Have a personal history or family history of: ? IUGR. ? A genetic disorder.  Take medicines during pregnancy that are related to congenital disabilities.  Come into contact with infected cat feces (toxoplasmosis).  Come into contact with chickenpox (varicella) or [place] measles (rubella).  Have or are at risk of getting an infectious disease such as syphilis, HIV, or herpes.  Eat an unhealthy diet during pregnancy.  Weigh less than 100 pounds.  Have had treatments to help her have children (infertility treatments).  Use tobacco, drugs, or alcohol during pregnancy. What are the signs or symptoms? IUGR does not cause many symptoms. You might notice that your baby does not move or kick very often. Also, your belly may not be as big as expected for the stage of your pregnancy. How is this diagnosed? This condition is diagnosed with physical exams and prenatal exams. You may also have:  Fundal measurements to check the size of your uterus.  An ultrasound done to measure your baby's size compared to the size of other babies at the same stage of development (gestational age). Your  health care provider will monitor your baby's growth with ultrasounds throughout pregnancy. You may also have tests to find the cause of IUGR. These may include:  Amniocentesis. This is a procedure that involves passing a needle into the uterus to collect a sample of fluid that surrounds the fetus (amniotic fluid). This may be done to check for signs of infection or congenital defects.  Tests to evaluate blood flow to your baby and placenta. How is this treated? In most cases, the goal of treatment is to treat the cause of IUGR. Your health care providers will monitor your pregnancy closely and help you manage your pregnancy. If your condition is caused by a placenta problem and your baby is not getting enough blood, you may need:  Medicine to start labor and deliver your baby early (induction).  Cesarean delivery, also called a C-section. In this procedure, your baby is delivered through an incision in your abdomen and uterus. Follow these instructions at home: Medicines  Take over-the-counter and prescription medicines only as told by your health  care provider. This includes vitamins and supplements.  Make sure that your health care provider knows about and approves of all the medicines, supplements, vitamins, eye drops, and creams that you use. General instructions  Eat a healthy diet that includes fresh fruits and vegetables, lean proteins, whole grains, and calcium-rich foods such as milk, yogurt, and dark, leafy greens. Work with your health care provider or a dietitian to make sure that: ? You are getting enough nutrients. ? You are gaining enough weight.  Rest as needed. Try to get at least 8 hours of sleep every night.  Do not drink alcohol or use drugs.  Do not use any products that contain nicotine or tobacco, such as cigarettes and e-cigarettes. If you need help quitting, ask your health care provider.  Keep all follow-up visits as told by your health care provider. This is  important. Get help right away if you:  Notice that your baby is moving less than usual or is not moving.  Have contractions that are 5 minutes or less apart, or that increase in frequency, intensity, or length.  Have signs and symptoms of infection, including a fever.  Have vaginal bleeding.  Have increased swelling in your legs, hands, or face.  Have vision changes, including seeing spots or having blurry or double vision.  Have a severe headache that does not go away.  Have sudden, sharp abdominal pain or low back pain.  Have an uncontrolled gush or trickle of fluid from your vagina. Summary  Intrauterine growth restriction (IUGR) is when a baby is not growing normally during pregnancy.  The most common cause of IUGR is a problem with the placenta or umbilical cord that causes the fetus to get less oxygen or nutrition than needed.  This condition is diagnosed with physical and prenatal exams. Your health care provider will monitor your baby's growth with ultrasounds throughout pregnancy.  Make sure that your health care provider knows about and approves of all the medicines, supplements, vitamins, eye drops, and creams that you use. This information is not intended to replace advice given to you by your health care provider. Make sure you discuss any questions you have with your health care provider. Document Released: 11/09/2007 Document Revised: 01/12/2017 Document Reviewed: 11/30/2016 Elsevier Patient Education  2020 [employer]

## 2018-12-04 NOTE — Progress Notes (Signed)
ROB- no concerns 

## 2018-12-05 DIAGNOSIS — Z5181 Encounter for therapeutic drug level monitoring: Secondary | ICD-10-CM | POA: Diagnosis not present

## 2018-12-06 ENCOUNTER — Other Ambulatory Visit: Payer: Self-pay | Admitting: Obstetrics and Gynecology

## 2018-12-06 DIAGNOSIS — O0990 Supervision of high risk pregnancy, unspecified, unspecified trimester: Secondary | ICD-10-CM

## 2018-12-06 DIAGNOSIS — O365990 Maternal care for other known or suspected poor fetal growth, unspecified trimester, not applicable or unspecified: Secondary | ICD-10-CM

## 2018-12-09 ENCOUNTER — Other Ambulatory Visit: Payer: Self-pay

## 2018-12-09 DIAGNOSIS — O0990 Supervision of high risk pregnancy, unspecified, unspecified trimester: Secondary | ICD-10-CM

## 2018-12-10 ENCOUNTER — Ambulatory Visit (INDEPENDENT_AMBULATORY_CARE_PROVIDER_SITE_OTHER): Payer: Medicaid Other

## 2018-12-10 ENCOUNTER — Other Ambulatory Visit: Payer: Self-pay

## 2018-12-10 DIAGNOSIS — O365990 Maternal care for other known or suspected poor fetal growth, unspecified trimester, not applicable or unspecified: Secondary | ICD-10-CM

## 2018-12-10 DIAGNOSIS — O365930 Maternal care for other known or suspected poor fetal growth, third trimester, not applicable or unspecified: Secondary | ICD-10-CM | POA: Diagnosis not present

## 2018-12-10 DIAGNOSIS — Z3A35 35 weeks gestation of pregnancy: Secondary | ICD-10-CM | POA: Diagnosis not present

## 2018-12-10 DIAGNOSIS — Z5181 Encounter for therapeutic drug level monitoring: Secondary | ICD-10-CM | POA: Diagnosis not present

## 2018-12-10 DIAGNOSIS — O0990 Supervision of high risk pregnancy, unspecified, unspecified trimester: Secondary | ICD-10-CM

## 2018-12-11 ENCOUNTER — Encounter: Payer: Medicaid Other | Admitting: Obstetrics and Gynecology

## 2018-12-12 ENCOUNTER — Ambulatory Visit
Admission: RE | Admit: 2018-12-12 | Discharge: 2018-12-12 | Disposition: A | Discharge status: Home / Self Care | Payer: Medicaid Other | Source: Ambulatory Visit | Attending: Obstetrics and Gynecology | Admitting: Obstetrics and Gynecology

## 2018-12-12 ENCOUNTER — Other Ambulatory Visit: Payer: Self-pay | Admitting: Advanced Practice Midwife

## 2018-12-12 ENCOUNTER — Ambulatory Visit
Admission: RE | Admit: 2018-12-12 | Discharge: 2018-12-12 | Disposition: A | Discharge status: Home / Self Care | Payer: Medicaid Other | Source: Ambulatory Visit | Attending: Maternal & Fetal Medicine | Admitting: Maternal & Fetal Medicine

## 2018-12-12 ENCOUNTER — Other Ambulatory Visit: Payer: Self-pay

## 2018-12-12 ENCOUNTER — Telehealth: Payer: Self-pay

## 2018-12-12 DIAGNOSIS — E669 Obesity, unspecified: Secondary | ICD-10-CM | POA: Insufficient documentation

## 2018-12-12 DIAGNOSIS — Z3A35 35 weeks gestation of pregnancy: Secondary | ICD-10-CM | POA: Insufficient documentation

## 2018-12-12 DIAGNOSIS — Z809 Family history of malignant neoplasm, unspecified: Secondary | ICD-10-CM | POA: Diagnosis not present

## 2018-12-12 DIAGNOSIS — Z808 Family history of malignant neoplasm of other organs or systems: Secondary | ICD-10-CM | POA: Insufficient documentation

## 2018-12-12 DIAGNOSIS — O0990 Supervision of high risk pregnancy, unspecified, unspecified trimester: Secondary | ICD-10-CM

## 2018-12-12 DIAGNOSIS — R7302 Impaired glucose tolerance (oral): Secondary | ICD-10-CM | POA: Diagnosis not present

## 2018-12-12 DIAGNOSIS — Z833 Family history of diabetes mellitus: Secondary | ICD-10-CM | POA: Diagnosis not present

## 2018-12-12 DIAGNOSIS — Z87891 Personal history of nicotine dependence: Secondary | ICD-10-CM | POA: Diagnosis not present

## 2018-12-12 DIAGNOSIS — O99283 Endocrine, nutritional and metabolic diseases complicating pregnancy, third trimester: Secondary | ICD-10-CM | POA: Insufficient documentation

## 2018-12-12 DIAGNOSIS — Z800 Family history of malignant neoplasm of digestive organs: Secondary | ICD-10-CM | POA: Diagnosis not present

## 2018-12-12 DIAGNOSIS — R7309 Other abnormal glucose: Secondary | ICD-10-CM

## 2018-12-12 DIAGNOSIS — O365990 Maternal care for other known or suspected poor fetal growth, unspecified trimester, not applicable or unspecified: Secondary | ICD-10-CM

## 2018-12-12 DIAGNOSIS — O99213 Obesity complicating pregnancy, third trimester: Secondary | ICD-10-CM | POA: Diagnosis not present

## 2018-12-12 LAB — GLUCOSE, CAPILLARY: Glucose-Capillary: 82 mg/dL (ref 70–99)

## 2018-12-12 MED ORDER — LANCETS 30G MISC: 0 refills | Status: DC

## 2018-12-12 MED ORDER — GLUCOSE BLOOD VI STRP: ORAL_STRIP | 12 refills | Status: DC

## 2018-12-12 NOTE — Progress Notes (Unsigned)
Reorder generic glucometer and supplies for medicaid coverage.

## 2018-12-12 NOTE — Telephone Encounter (Signed)
Donna David from [facility] calling.  Pt hasn't been able to p/u glucose monitor b/c it is not covered by [employer].  Please order one that is covered by [employer] and let the pt know.

## 2018-12-12 NOTE — Telephone Encounter (Signed)
[employer] in training this afternoon. Will have to wait till tomorrow for her to do this for pt.

## 2018-12-12 NOTE — Progress Notes (Signed)
[place] Consultation    HPI: Donna David is a 28 y.o. G2P1001 at 5w5dby seen for MFM consultation due to history of GDM based on 1 hour GTT of 160. She had an ultrasound today due to suspected FGR.   She has not been checking blood glucose values due to not having supplies yet. Blood glucose here was 82 (after sweet tea)  [place] demonstrated fetal growth restriction with efw at 2nd percentile with nl fluid and dopplers  Past Medical History: Patient  has a past medical history of Obesity (BMI 35.0-39.9 without comorbidity) and Third degree perineal laceration.  Past Surgical History: She  has a past surgical history that includes Tonsillectomy and Repair of third degree perineal laceration (12/2013).  Obstetric History:  OB History    Gravida  2   Para  1   Term  1   Preterm      AB      Living  1     SAB      TAB      Ectopic      Multiple      Live Births  1         2015, SVD 6lb 6 oz, [facility], no complications   Current Outpatient Medications on File Prior to Encounter  Medication Sig Dispense Refill  . Accu-Chek FastClix Lancets MISC 1 Units by Percutaneous route 4 (four) times daily. 100 each 12  . Blood Glucose Monitoring Suppl (ACCU-CHEK GUIDE) w/Device KIT 1 each by Does not apply route 4 (four) times daily. 1 kit 0  . glucose blood (ACCU-CHEK GUIDE) test strip Use as instructed 100 each 12  . glucose blood test strip Use as instructed 100 each 12  . Prenatal Vit-Fe Fumarate-FA (MULTIVITAMIN-PRENATAL) 27-0.8 MG TABS tablet Take 1 tablet by mouth daily at 12 noon.     No current facility-administered medications on file prior to encounter.      Allergies: Patient has No Known Allergies.  Social History: Patient  reports that she has quit smoking. She has never used smokeless tobacco. She reports previous alcohol use. She reports that she does not use drugs.  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    Physical Exam: LMP 03/25/2018  BP 122/85  Plan: Glucose intolerance/suspected GDM: Concerns related to diabetes in pregnancy are largely due to complications associated with poor glycemic control.  Risks for GDM may be her elevated BMI.     Later in gestation, hyperglycemia may lead to fetal macrosomia, increased operative delivery/birth injury, intrauterine fetal demise and and post natal metabolic derangements and need for neonatal intensive care unit management of hypoglycemia.Close monitoring and management of glycemic control can decrease this risks --she is planning to obtain glucometer to closely monitor blood glucose values    We addressed pregnancy surveillance with 4x daily glucose monitoring (targets during pregnacy: fasting <95, 1h post prandial blood glucose values less than 120).  --recommend surveillance (for FGR) twice weekly antenatal testing with weekly dopplers and AFI   2. FGR: [place]. we reviewed concerns related to FGR in pregnancy --recommended delivery in 37th week --twice  weekly testing with weekly dopplers and AFI  Total time spent with the patient was 20 minutes with greater than 50% spent in counseling and coordination of care. We appreciate this interesting consult and will be happy to be involved in the ongoing care of Ms. Konczal in anyway her obstetricians desire.  Manfred Shirts, MD [facility]

## 2018-12-16 ENCOUNTER — Other Ambulatory Visit: Payer: Self-pay

## 2018-12-16 ENCOUNTER — Ambulatory Visit
Admission: RE | Admit: 2018-12-16 | Discharge: 2018-12-16 | Disposition: A | Discharge status: Home / Self Care | Payer: Medicaid Other | Source: Ambulatory Visit | Attending: Obstetrics and Gynecology | Admitting: Obstetrics and Gynecology

## 2018-12-16 ENCOUNTER — Other Ambulatory Visit: Payer: Medicaid Other

## 2018-12-16 ENCOUNTER — Other Ambulatory Visit: Payer: Self-pay | Admitting: Maternal & Fetal Medicine

## 2018-12-16 DIAGNOSIS — E669 Obesity, unspecified: Secondary | ICD-10-CM | POA: Insufficient documentation

## 2018-12-16 DIAGNOSIS — O368990 Maternal care for other specified fetal problems, unspecified trimester, not applicable or unspecified: Secondary | ICD-10-CM | POA: Diagnosis not present

## 2018-12-16 DIAGNOSIS — O365990 Maternal care for other known or suspected poor fetal growth, unspecified trimester, not applicable or unspecified: Secondary | ICD-10-CM | POA: Diagnosis present

## 2018-12-16 DIAGNOSIS — Z3A36 36 weeks gestation of pregnancy: Secondary | ICD-10-CM | POA: Insufficient documentation

## 2018-12-16 DIAGNOSIS — O99213 Obesity complicating pregnancy, third trimester: Secondary | ICD-10-CM | POA: Diagnosis not present

## 2018-12-16 LAB — GLUCOSE, CAPILLARY: Glucose-Capillary: 105 mg/dL — ABNORMAL HIGH (ref 70–99)

## 2018-12-17 ENCOUNTER — Encounter: Payer: Self-pay | Admitting: Maternal Newborn

## 2018-12-17 ENCOUNTER — Other Ambulatory Visit (HOSPITAL_COMMUNITY)
Admission: RE | Admit: 2018-12-17 | Discharge: 2018-12-17 | Disposition: A | Discharge status: Home / Self Care | Payer: Medicaid Other | Source: Ambulatory Visit | Attending: Obstetrics and Gynecology | Admitting: Obstetrics and Gynecology

## 2018-12-17 ENCOUNTER — Ambulatory Visit (INDEPENDENT_AMBULATORY_CARE_PROVIDER_SITE_OTHER): Payer: Medicaid Other | Admitting: Maternal Newborn

## 2018-12-17 ENCOUNTER — Other Ambulatory Visit: Payer: Self-pay

## 2018-12-17 VITALS — BP 128/70 | Wt 263.0 lb

## 2018-12-17 DIAGNOSIS — O365930 Maternal care for other known or suspected poor fetal growth, third trimester, not applicable or unspecified: Secondary | ICD-10-CM | POA: Diagnosis not present

## 2018-12-17 DIAGNOSIS — O0990 Supervision of high risk pregnancy, unspecified, unspecified trimester: Secondary | ICD-10-CM | POA: Insufficient documentation

## 2018-12-17 DIAGNOSIS — O365990 Maternal care for other known or suspected poor fetal growth, unspecified trimester, not applicable or unspecified: Secondary | ICD-10-CM

## 2018-12-17 DIAGNOSIS — O0993 Supervision of high risk pregnancy, unspecified, third trimester: Secondary | ICD-10-CM

## 2018-12-17 DIAGNOSIS — Z3A36 36 weeks gestation of pregnancy: Secondary | ICD-10-CM

## 2018-12-17 NOTE — Patient Instructions (Addendum)
Induction scheduled on 12/22/2018 at 0800. Please go to the drive up test site at the main entrance of [facility] to complete the COVID-19 test  from your car.  Labor Induction  Labor induction is when steps are taken to cause a pregnant woman to begin the labor process. Most women go into labor on their own between 37 weeks and 42 weeks of pregnancy. When this does not happen or when there is a medical need for labor to begin, steps may be taken to induce labor. Labor induction causes a pregnant woman's uterus to contract. It also causes the cervix to soften (ripen), open (dilate), and thin out (efface). Usually, labor is not induced before 39 weeks of pregnancy unless there is a medical reason to do so. Your health care provider will determine if labor induction is needed. Before inducing labor, your health care provider will consider a number of factors, including:  Your medical condition and your baby's.  How many weeks along you are in your pregnancy.  How mature your baby's lungs are.  The condition of your cervix.  The position of your baby.  The size of your birth canal. What are some reasons for labor induction? Labor may be induced if:  Your health or your baby's health is at risk.  Your pregnancy is overdue by 1 week or more.  Your water breaks but labor does not start on its own.  There is a low amount of amniotic fluid around your baby. You may also choose (elect) to have labor induced at a certain time. Generally, elective labor induction is done no earlier than 39 weeks of pregnancy. What methods are used for labor induction? Methods used for labor induction include:  Prostaglandin medicine. This medicine starts contractions and causes the cervix to dilate and ripen. It can be taken by mouth (orally) or by being inserted into the vagina (suppository).  Inserting a small, thin tube (catheter) with a balloon into the vagina and then expanding the balloon with water to dilate  the cervix.  Stripping the membranes. In this method, your health care provider gently separates amniotic sac tissue from the cervix. This causes the cervix to stretch, which in turn causes the release of a hormone called progesterone. The hormone causes the uterus to contract. This procedure is often done during an office visit, after which you will be sent home to wait for contractions to begin.  Breaking the water. In this method, your health care provider uses a small instrument to make a small hole in the amniotic sac. This eventually causes the amniotic sac to break. Contractions should begin after a few hours.  Medicine to trigger or strengthen contractions. This medicine is given through an IV that is inserted into a vein in your arm. Except for membrane stripping, which can be done in a clinic, labor induction is done in the hospital so that you and your baby can be carefully monitored. How long does it take for labor to be induced? The length of time it takes to induce labor depends on how ready your body is for labor. Some inductions can take up to 2-3 days, while others may take less than a day. Induction may take longer if:  You are induced early in your pregnancy.  It is your first pregnancy.  Your cervix is not ready. What are some risks associated with labor induction? Some risks associated with labor induction include:  Changes in fetal heart rate, such as being too high, too  low, or irregular (erratic).  Failed induction.  Infection in the mother or the baby.  Increased risk of having a cesarean delivery.  Fetal death.  Breaking off (abruption) of the placenta from the uterus (rare).  Rupture of the uterus (very rare). When induction is needed for medical reasons, the benefits of induction generally outweigh the risks. What are some reasons for not inducing labor? Labor induction should not be done if:  Your baby does not tolerate contractions.  You have had  previous surgeries on your uterus, such as a myomectomy, removal of fibroids, or a vertical scar from a previous cesarean delivery.  Your placenta lies very low in your uterus and blocks the opening of the cervix (placenta previa).  Your baby is not in a head-down position.  The umbilical cord drops down into the birth canal in front of the baby.  There are unusual circumstances, such as the baby being very early (premature).  You have had more than 2 previous cesarean deliveries. Summary  Labor induction is when steps are taken to cause a pregnant woman to begin the labor process.  Labor induction causes a pregnant woman's uterus to contract. It also causes the cervix to ripen, dilate, and efface.  Labor is not induced before 39 weeks of pregnancy unless there is a medical reason to do so.  When induction is needed for medical reasons, the benefits of induction generally outweigh the risks. This information is not intended to replace advice given to you by your health care provider. Make sure you discuss any questions you have with your health care provider. Document Released: 06/21/2006 Document Revised: 02/02/2017 Document Reviewed: 03/15/2016 Elsevier Patient Education  2020 [employer].

## 2018-12-17 NOTE — Progress Notes (Signed)
ROB GBS/Aptima  C/o had some vaginal discharge Suppose to be scheduled for induction today for next week Denies lof, no vb Good FM

## 2018-12-17 NOTE — Progress Notes (Signed)
Routine Prenatal Care Visit  Subjective  Donna David is a 28 y.o. G2P1001 at [redacted]w[redacted]d being seen today for ongoing prenatal care.  She is currently monitored for the following issues for this high-risk pregnancy and has Supervision of high risk pregnancy, antepartum; Obesity in pregnancy; Obesity (BMI 35.0-39.9 without comorbidity); History of third degree perineal laceration; Chlamydia infection complicating pregnancy; Abnormal oral glucose tolerance test; and Diet controlled gestational diabetes mellitus (GDM) in third trimester on their problem list.  ----------------------------------------------------------------------------------- Patient reports no complaints.   Contractions: Not present. Vag. Bleeding: None.  Movement: Present. No leaking of fluid.  ----------------------------------------------------------------------------------- The following portions of the patient's history were reviewed and updated as appropriate: allergies, current medications, past family history, past medical history, past social history, past surgical history and problem list. Problem list updated.  Objective  Blood pressure 128/70, weight 263 lb (119.3 kg), last menstrual period 03/25/2018. Pregravid weight 257 lb (116.6 kg) Total Weight Gain 6 lb (2.722 kg)   Fetal Status: Fetal Heart Rate (bpm): 135   Movement: Present     General:  Alert, oriented and cooperative. Patient is in no acute distress.  Skin: Skin is warm and dry. No rash noted.   Cardiovascular: Normal heart rate noted  Respiratory: Normal respiratory effort, no problems with respiration noted  Abdomen: Soft, gravid, appropriate for gestational age. Pain/Pressure: Absent     Pelvic:  Cervical exam deferred        Extremities: Normal range of motion.  Edema: None  Mental Status: Normal mood and affect. Normal behavior. Normal judgment and thought content.   NST Baseline: 135 bpm Variability: moderate Accelerations: present  Decelerations: absent Tocometry: not done The patient was monitored for 20 minutes, fetal heart rate tracing was deemed reactive.  Assessment   28 y.o. G2P1001 at [redacted]w[redacted]d, EDD 01/11/2019 by Ultrasound presenting for a routine prenatal visit.  Plan   pregnancy 2 Problems (from 03/25/18 to present)    Problem Noted Resolved   Chlamydia infection complicating pregnancy 7/82/9562 by Malachy Mood, MD No   Abnormal oral glucose tolerance test 09/10/2018 by Malachy Mood, MD No   Overview Signed 11/05/2018  3:39 PM by Malachy Mood, MD    Early 1-hr 140, never did follow up 3-hr 28 week 1-hr 160      Supervision of high risk pregnancy, antepartum 07/16/2018 by Dalia Heading, CNM No   Overview Addendum 11/05/2018  3:39 PM by Malachy Mood, MD    Clinic [facility] Prenatal Labs  Dating 18 week [place] Blood type: O/Positive/-- (06/02 1146)   Genetic Screen 1 Screen:    AFP:     Quad:     NIPS: Antibody:Negative (06/02 1146)  Anatomic [place] Complete Rubella: 1.84 (06/02 1146) Varicella: Immune  GTT Early: 140  Third trimester: 160 RPR: Non Reactive (06/02 1146)   Rhogam  HBsAg: Negative (06/02 1146)   TDaP vaccine                       Flu Shot: HIV: Non Reactive (06/02 1146)   Baby Food                                GBS:   Contraception  Pap: 07/16/2018, NILM  CBB     CS/VBAC    Support Person               NST reactive. Reviewed AFI from ultrasound done on  11/2 at MFM; normal at 8.42 cm. GBS and Aptima done.  No glucose log at visit today, reports that she has had fasting values in the 80s and postprandial values in the 110s -120s range, no abnormal values.  Discussed labor induction as recommended by MFM at 37 weeks for IUGR. Scheduled today for 12/22/2018 at 0800. Discussed pre-admission COVID testing.  Please refer to After Visit Summary for other counseling recommendations.   Return in about 3 days (around 12/20/2018) for HROB with NST.  Marcelyn Bruins, CNM 12/17/2018   2:44 PM

## 2018-12-18 ENCOUNTER — Other Ambulatory Visit: Payer: Self-pay | Admitting: Maternal Newborn

## 2018-12-18 ENCOUNTER — Other Ambulatory Visit: Payer: Self-pay

## 2018-12-18 ENCOUNTER — Observation Stay
Admission: EM | Admit: 2018-12-18 | Discharge: 2018-12-19 | Disposition: A | Discharge status: Home / Self Care | Payer: Medicaid Other | Attending: Maternal Newborn | Admitting: Maternal Newborn

## 2018-12-18 DIAGNOSIS — O0990 Supervision of high risk pregnancy, unspecified, unspecified trimester: Secondary | ICD-10-CM

## 2018-12-18 DIAGNOSIS — E669 Obesity, unspecified: Secondary | ICD-10-CM | POA: Insufficient documentation

## 2018-12-18 DIAGNOSIS — Z833 Family history of diabetes mellitus: Secondary | ICD-10-CM | POA: Insufficient documentation

## 2018-12-18 DIAGNOSIS — O365990 Maternal care for other known or suspected poor fetal growth, unspecified trimester, not applicable or unspecified: Secondary | ICD-10-CM | POA: Diagnosis present

## 2018-12-18 DIAGNOSIS — O26893 Other specified pregnancy related conditions, third trimester: Principal | ICD-10-CM | POA: Insufficient documentation

## 2018-12-18 DIAGNOSIS — R109 Unspecified abdominal pain: Secondary | ICD-10-CM | POA: Insufficient documentation

## 2018-12-18 DIAGNOSIS — Z87891 Personal history of nicotine dependence: Secondary | ICD-10-CM | POA: Insufficient documentation

## 2018-12-18 DIAGNOSIS — O99213 Obesity complicating pregnancy, third trimester: Secondary | ICD-10-CM | POA: Insufficient documentation

## 2018-12-18 DIAGNOSIS — O365930 Maternal care for other known or suspected poor fetal growth, third trimester, not applicable or unspecified: Secondary | ICD-10-CM | POA: Insufficient documentation

## 2018-12-18 DIAGNOSIS — O24410 Gestational diabetes mellitus in pregnancy, diet controlled: Secondary | ICD-10-CM | POA: Insufficient documentation

## 2018-12-18 DIAGNOSIS — R7309 Other abnormal glucose: Secondary | ICD-10-CM

## 2018-12-18 DIAGNOSIS — A749 Chlamydial infection, unspecified: Secondary | ICD-10-CM

## 2018-12-18 DIAGNOSIS — Z3A36 36 weeks gestation of pregnancy: Secondary | ICD-10-CM | POA: Insufficient documentation

## 2018-12-18 MED ORDER — GLUCOSE BLOOD VI STRP: ORAL_STRIP | 12 refills | Status: DC

## 2018-12-18 NOTE — Progress Notes (Signed)
Rx changed to Accu Chek test strips per request.

## 2018-12-18 NOTE — Telephone Encounter (Signed)
This encounter was created in error - please disregard.

## 2018-12-19 ENCOUNTER — Encounter: Payer: Medicaid Other | Admitting: Advanced Practice Midwife

## 2018-12-19 ENCOUNTER — Other Ambulatory Visit: Payer: Self-pay

## 2018-12-19 DIAGNOSIS — O365990 Maternal care for other known or suspected poor fetal growth, unspecified trimester, not applicable or unspecified: Secondary | ICD-10-CM | POA: Diagnosis present

## 2018-12-19 DIAGNOSIS — Z833 Family history of diabetes mellitus: Secondary | ICD-10-CM | POA: Diagnosis not present

## 2018-12-19 DIAGNOSIS — Z5181 Encounter for therapeutic drug level monitoring: Secondary | ICD-10-CM | POA: Diagnosis not present

## 2018-12-19 DIAGNOSIS — R109 Unspecified abdominal pain: Secondary | ICD-10-CM | POA: Diagnosis not present

## 2018-12-19 DIAGNOSIS — Z3A36 36 weeks gestation of pregnancy: Secondary | ICD-10-CM | POA: Diagnosis not present

## 2018-12-19 DIAGNOSIS — Z87891 Personal history of nicotine dependence: Secondary | ICD-10-CM | POA: Diagnosis not present

## 2018-12-19 DIAGNOSIS — Z3A37 37 weeks gestation of pregnancy: Secondary | ICD-10-CM | POA: Diagnosis not present

## 2018-12-19 DIAGNOSIS — E669 Obesity, unspecified: Secondary | ICD-10-CM | POA: Diagnosis not present

## 2018-12-19 DIAGNOSIS — O26893 Other specified pregnancy related conditions, third trimester: Secondary | ICD-10-CM | POA: Diagnosis not present

## 2018-12-19 DIAGNOSIS — O99213 Obesity complicating pregnancy, third trimester: Secondary | ICD-10-CM | POA: Diagnosis not present

## 2018-12-19 DIAGNOSIS — O24410 Gestational diabetes mellitus in pregnancy, diet controlled: Secondary | ICD-10-CM | POA: Diagnosis not present

## 2018-12-19 DIAGNOSIS — O365930 Maternal care for other known or suspected poor fetal growth, third trimester, not applicable or unspecified: Secondary | ICD-10-CM | POA: Diagnosis not present

## 2018-12-19 MED ORDER — ONDANSETRON 4 MG PO TBDP
ORAL_TABLET | ORAL | Status: AC
  Administered 2018-12-19: 01:00:00 4 mg via ORAL
  Filled 2018-12-19: qty 1

## 2018-12-19 MED ORDER — ACETAMINOPHEN 500 MG PO TABS: 1000.0000 mg | ORAL_TABLET | Freq: Four times a day (QID) | ORAL | Status: DC | PRN

## 2018-12-19 MED ORDER — ONDANSETRON 4 MG PO TBDP
4.0000 mg | ORAL_TABLET | Freq: Once | ORAL | Status: AC
  Administered 2018-12-19: 01:00:00 4 mg via ORAL

## 2018-12-19 NOTE — Discharge Instructions (Signed)

## 2018-12-19 NOTE — OB Triage Note (Signed)
Pt arrived to unit with c/o abdominal pain and discomfort 5/10 and lower pelvic pressure starting around 2145 on 12/18/2018. +fM, intermittent ctx, denies LOF or vaginal bleeding. Continue to assess.

## 2018-12-19 NOTE — Discharge Summary (Signed)
Physician Final Progress Note  Patient ID: Donna David MRN: [id number] DOB/AGE: 11-Dec-1990 28 y.o.  Admit date: 12/18/2018 Admitting provider: Homero Fellers, MD Discharge date: 12/19/2018   Admission Diagnoses: Abdominal pain in pregnancy  Discharge Diagnoses: None  History of Present Illness: The patient is a 28 y.o. female G2P1001 at 81w5dwho presents for abdominal pain that began shortly before 10 pm, rated intensity as 5/10. She also stated that she had some intermittent contractions, uncertain fi [employer] No vaginal bleeding or loss of fluid. Positive fetal movement. Stated that she had nausea shortly after arrival.  Patient Active Problem List   Diagnosis Date Noted  . Indication for care in labor and delivery, antepartum 12/19/2018  . IUGR (intrauterine growth restriction) affecting care of mother 12/19/2018  . Diet controlled gestational diabetes mellitus (GDM) in third trimester 12/18/2018  . Chlamydia infection complicating pregnancy 012/75/1700 . Abnormal oral glucose tolerance test 09/10/2018  . Supervision of high risk pregnancy, antepartum 07/16/2018  . Obesity in pregnancy 07/16/2018  . Obesity (BMI 35.0-39.9 without comorbidity) 07/16/2018  . History of third degree perineal laceration 07/16/2018    Review of Systems: Review of systems negative unless otherwise noted in HPI.   Past Medical History:  Diagnosis Date  . Obesity (BMI 35.0-39.9 without comorbidity)   . Third degree perineal laceration    with G1    Past Surgical History:  Procedure Laterality Date  . Repair of third degree perineal laceration  12/2013  . TONSILLECTOMY      No current facility-administered medications on file prior to encounter.    Current Outpatient Medications on File Prior to Encounter  Medication Sig Dispense Refill  . Prenatal Vit-Fe Fumarate-FA (MULTIVITAMIN-PRENATAL) 27-0.8 MG TABS tablet Take 1 tablet by mouth daily at 12 noon.    . Blood Glucose  Monitoring Suppl (ACCU-CHEK GUIDE) w/Device KIT 1 each by Does not apply route 4 (four) times daily. 1 kit 0  . glucose blood test strip Use as directed 100 each 12  . Lancets 30G MISC Check blood sugar 4 times daily, fasting and 2 hours after each meal 100 each 0    No Known Allergies  Social History   Socioeconomic History  . Marital status: Significant Other    Spouse name: Vernata  . Number of children: 1  . Years of education: Not on file  . Highest education level: Not on file  Occupational History  . Not on file  Social Needs  . Financial resource strain: Not on file  . Food insecurity    Worry: Not on file    Inability: Not on file  . Transportation needs    Medical: Not on file    Non-medical: Not on file  Tobacco Use  . Smoking status: Former SResearch scientist (life sciences) . Smokeless tobacco: Never Used  Substance and Sexual Activity  . Alcohol use: Not Currently  . Drug use: No  . Sexual activity: Yes    Partners: Male  Lifestyle  . Physical activity    Days per week: Not on file    Minutes per session: Not on file  . Stress: Not on file  Relationships  . Social [place] phone: Not on file    Gets together: Not on file    Attends religious service: Not on file    Active member of club or organization: Not on file    Attends meetings of clubs or organizations: Not on file    Relationship status:  Not on file  . Intimate partner violence    Fear of current or ex partner: Not on file    Emotionally abused: Not on file    Physically abused: Not on file    Forced sexual activity: Not on file  Other Topics Concern  . Not on file  Social History Narrative  . Not on file    Family History  Problem Relation Age of Onset  . Hypertension Mother   . Melanoma Maternal Grandmother   . Heart attack Maternal Grandfather   . Hypertension Maternal Grandfather   . Heart disease Maternal Grandfather   . Cancer Maternal Great-grandfather   . Heart attack Paternal  Grandfather   . Hypertension Paternal Grandfather   . Diabetes Paternal Grandfather   . Hypertension Paternal Uncle   . Autism Cousin   . Stomach cancer Paternal Great-grandmother   . Cancer Maternal Great-grandmother      Physical Exam: BP 117/70   Pulse 71   Temp 98.3 F (36.8 C) (Oral)   Resp 18   Ht _0  (1.803 m)   Wt 118.4 kg   LMP 03/25/2018   BMI 36.40 kg/m   Gen: NAD Abdomen: gravid, non-tender Pulm: No increased work of breathing Pelvic: patient declined Ext: No signs of DVT  NST Baseline: 120 bpm Variability: moderate Accelerations: present Decelerations: absent Tocometry: irregular contractions The patient was monitored for 30+ minutes, fetal heart rate tracing was deemed reactive.  Hospital Course: Nausea and pain improved after a dose of Zofran and patient was able to sleep during monitoring. Discussed cervical exam, and patient declined. She stated that she was feeling better and denied pain. Requested discharge home.  Procedures: NST  Discharge Condition: good  Disposition: Discharge disposition: 01-Home or Self Care       Diet: Regular diet  Discharge Activity: Activity as tolerated  Discharge Instructions    Discharge activity:  No Restrictions   Complete by: As directed    Discharge diet:  No restrictions   Complete by: As directed    Fetal Kick Count:  Lie on our left side for one hour after a meal, and count the number of times your baby kicks.  If it is less than 5 times, get up, move around and drink some juice.  Repeat the test 30 minutes later.  If it is still less than 5 kicks in an hour, notify your doctor.   Complete by: As directed    LABOR:  When conractions begin, you should start to time them from the beginning of one contraction to the beginning  of the next.  When contractions are 5 - 10 minutes apart or less and have been regular for at least an hour, you should call your health care provider.   Complete by: As directed     No sexual activity restrictions   Complete by: As directed    Notify physician for bleeding from the vagina   Complete by: As directed    Notify physician for blurring of vision or spots before the eyes   Complete by: As directed    Notify physician for chills or fever   Complete by: As directed    Notify physician for fainting spells, "black outs" or loss of consciousness   Complete by: As directed    Notify physician for increase in vaginal discharge   Complete by: As directed    Notify physician for leaking of fluid   Complete by: As directed    Notify  physician for pain or burning when urinating   Complete by: As directed    Notify physician for pelvic pressure (sudden increase)   Complete by: As directed    Notify physician for severe or continued nausea or vomiting   Complete by: As directed    Notify physician for sudden gushing of fluid from the vagina (with or without continued leaking)   Complete by: As directed    Notify physician for sudden, constant, or occasional abdominal pain   Complete by: As directed    Notify physician if baby moving less than usual   Complete by: As directed      Allergies as of 12/19/2018   No Known Allergies     Medication List    TAKE these medications   Accu-Chek Guide w/Device Kit 1 each by Does not apply route 4 (four) times daily.   glucose blood test strip Use as directed   Lancets 30G Misc Check blood sugar 4 times daily, fasting and 2 hours after each meal   multivitamin-prenatal 27-0.8 MG Tabs tablet Take 1 tablet by mouth daily at 12 noon.      [place]. Go on 12/20/2018.   Why: please go to your regularly scheduled OB appointment. Call your doctor or return to the ED for any question or concerns. Contact information: [place] [address] [phone]         [place] on 11/6 and IOL scheduled for 12/22/2018  Signed: Rexene Agent, CNM   12/19/2018, 2:16 AM

## 2018-12-20 ENCOUNTER — Ambulatory Visit (INDEPENDENT_AMBULATORY_CARE_PROVIDER_SITE_OTHER): Payer: Medicaid Other | Admitting: Obstetrics and Gynecology

## 2018-12-20 ENCOUNTER — Other Ambulatory Visit
Admission: RE | Admit: 2018-12-20 | Discharge: 2018-12-20 | Disposition: A | Discharge status: Home / Self Care | Payer: Medicaid Other | Source: Ambulatory Visit | Attending: Obstetrics and Gynecology | Admitting: Obstetrics and Gynecology

## 2018-12-20 VITALS — BP 124/72 | Wt 262.0 lb

## 2018-12-20 DIAGNOSIS — O99213 Obesity complicating pregnancy, third trimester: Secondary | ICD-10-CM

## 2018-12-20 DIAGNOSIS — Z3A36 36 weeks gestation of pregnancy: Secondary | ICD-10-CM

## 2018-12-20 DIAGNOSIS — O365990 Maternal care for other known or suspected poor fetal growth, unspecified trimester, not applicable or unspecified: Secondary | ICD-10-CM

## 2018-12-20 DIAGNOSIS — O99210 Obesity complicating pregnancy, unspecified trimester: Secondary | ICD-10-CM

## 2018-12-20 DIAGNOSIS — Z01812 Encounter for preprocedural laboratory examination: Secondary | ICD-10-CM | POA: Insufficient documentation

## 2018-12-20 DIAGNOSIS — O09293 Supervision of pregnancy with other poor reproductive or obstetric history, third trimester: Secondary | ICD-10-CM

## 2018-12-20 DIAGNOSIS — Z20828 Contact with and (suspected) exposure to other viral communicable diseases: Secondary | ICD-10-CM | POA: Insufficient documentation

## 2018-12-20 DIAGNOSIS — O0990 Supervision of high risk pregnancy, unspecified, unspecified trimester: Secondary | ICD-10-CM

## 2018-12-20 DIAGNOSIS — Z8759 Personal history of other complications of pregnancy, childbirth and the puerperium: Secondary | ICD-10-CM

## 2018-12-20 DIAGNOSIS — O0993 Supervision of high risk pregnancy, unspecified, third trimester: Secondary | ICD-10-CM

## 2018-12-20 DIAGNOSIS — O24410 Gestational diabetes mellitus in pregnancy, diet controlled: Secondary | ICD-10-CM

## 2018-12-20 DIAGNOSIS — O365930 Maternal care for other known or suspected poor fetal growth, third trimester, not applicable or unspecified: Secondary | ICD-10-CM

## 2018-12-20 LAB — CERVICOVAGINAL ANCILLARY ONLY
Chlamydia: NEGATIVE
Comment: NEGATIVE
Comment: NORMAL
Neisseria Gonorrhea: NEGATIVE

## 2018-12-20 LAB — STREP GP B NAA: Strep Gp B NAA: NEGATIVE

## 2018-12-20 LAB — SARS CORONAVIRUS 2 (TAT 6-24 HRS): SARS Coronavirus 2: NEGATIVE

## 2018-12-20 NOTE — Progress Notes (Signed)
Routine Prenatal Care Visit  Subjective  Donna David is a 28 y.o. G2P1001 at [redacted]w[redacted]d being seen today for ongoing prenatal care.  She is currently monitored for the following issues for this high-risk pregnancy and has Supervision of high risk pregnancy, antepartum; Obesity in pregnancy; Obesity (BMI 35.0-39.9 without comorbidity); History of third degree perineal laceration; Chlamydia infection complicating pregnancy; Abnormal oral glucose tolerance test; Diet controlled gestational diabetes mellitus (GDM) in third trimester; Indication for care in labor and delivery, antepartum; IUGR (intrauterine growth restriction) affecting care of mother; Encounter for induction of labor; Variable fetal heart rate decelerations, delivered; Liveborn infant by vaginal delivery; and Postpartum care following vaginal delivery on their problem list.  ----------------------------------------------------------------------------------- Patient reports no complaints.   Contractions: Not present. Vag. Bleeding: None.  Movement: Present. Denies leaking of fluid.  ----------------------------------------------------------------------------------- The following portions of the patient's history were reviewed and updated as appropriate: allergies, current medications, past family history, past medical history, past social history, past surgical history and problem list. Problem list updated.   Objective  Blood pressure 124/72, weight 262 lb (118.8 kg), last menstrual period 03/25/2018. Pregravid weight 257 lb (116.6 kg) Total Weight Gain 5 lb (2.268 kg) Urinalysis:      Fetal Status: Fetal Heart Rate (bpm): 140   Movement: Present     General:  Alert, oriented and cooperative. Patient is in no acute distress.  Skin: Skin is warm and dry. No rash noted.   Cardiovascular: Normal heart rate noted  Respiratory: Normal respiratory effort, no problems with respiration noted  Abdomen: Soft, gravid, appropriate for  gestational age. Pain/Pressure: Absent     Pelvic:  Cervical exam deferred        Extremities: Normal range of motion.     ental Status: Normal mood and affect. Normal behavior. Normal judgment and thought content.     Assessment   28 y.o. G2P1001 at [redacted]w[redacted]d by  01/11/2019, by Ultrasound presenting for routine prenatal visit  Plan   pregnancy 2 Problems (from 03/25/18 to present)    Problem Noted Resolved   IUGR (intrauterine growth restriction) affecting care of mother 12/19/2018 by Oswaldo Conroy, CNM No   Chlamydia infection complicating pregnancy 09/10/2018 by Vena Austria, MD No   Abnormal oral glucose tolerance test 09/10/2018 by Vena Austria, MD No   Overview Signed 11/05/2018  3:39 PM by Vena Austria, MD    Early 1-hr 140, never did follow up 3-hr 28 week 1-hr 160      Supervision of high risk pregnancy, antepartum 07/16/2018 by Farrel Conners, CNM No   Overview Addendum 11/05/2018  3:39 PM by Vena Austria, MD    [facility] Prenatal Labs  Dating 18 week [place] Blood type: O/Positive/-- (06/02 1146)   Genetic Screen 1 Screen:    AFP:     Quad:     NIPS: Antibody:Negative (06/02 1146)  Anatomic [place] Complete Rubella: 1.84 (06/02 1146) Varicella: Immune  GTT Early: 140  Third trimester: 160 RPR: Non Reactive (06/02 1146)   Rhogam  HBsAg: Negative (06/02 1146)   TDaP vaccine                       Flu Shot: HIV: Non Reactive (06/02 1146)   Baby Food                                GBS:   Contraception  Pap: 07/16/2018, NILM  CBB     CS/VBAC    Support Person                  Gestational age appropriate obstetric precautions including but not limited to vaginal bleeding, contractions, leaking of fluid and fetal movement were reviewed in detail with the patient.    Declined NST today was seen in L&D earlier this week, scheduled for induction this Sunday  No follow-ups on file.  Malachy Mood, MD, [facility], [facility]  12/20/2018, 2:28 PM

## 2018-12-20 NOTE — Progress Notes (Signed)
ROB Declined NST being induced 11/8

## 2018-12-22 ENCOUNTER — Inpatient Hospital Stay
Admission: AD | Admit: 2018-12-22 | Discharge: 2018-12-24 | DRG: 806 | Disposition: A | Discharge status: Home / Self Care | Payer: Medicaid Other | Attending: Obstetrics & Gynecology | Admitting: Obstetrics & Gynecology

## 2018-12-22 ENCOUNTER — Other Ambulatory Visit: Payer: Self-pay

## 2018-12-22 ENCOUNTER — Encounter: Payer: Self-pay | Admitting: Anesthesiology

## 2018-12-22 ENCOUNTER — Inpatient Hospital Stay: Payer: Medicaid Other | Admitting: Anesthesiology

## 2018-12-22 ENCOUNTER — Emergency Department
Admission: EM | Admit: 2018-12-22 | Discharge: 2018-12-22 | Discharge status: Absent without leave | Payer: Medicaid Other | Source: Home / Self Care

## 2018-12-22 DIAGNOSIS — O9832 Other infections with a predominantly sexual mode of transmission complicating childbirth: Secondary | ICD-10-CM | POA: Diagnosis not present

## 2018-12-22 DIAGNOSIS — F1290 Cannabis use, unspecified, uncomplicated: Secondary | ICD-10-CM

## 2018-12-22 DIAGNOSIS — O24420 Gestational diabetes mellitus in childbirth, diet controlled: Secondary | ICD-10-CM | POA: Diagnosis present

## 2018-12-22 DIAGNOSIS — E669 Obesity, unspecified: Secondary | ICD-10-CM | POA: Diagnosis present

## 2018-12-22 DIAGNOSIS — Z3490 Encounter for supervision of normal pregnancy, unspecified, unspecified trimester: Secondary | ICD-10-CM | POA: Diagnosis present

## 2018-12-22 DIAGNOSIS — Z87891 Personal history of nicotine dependence: Secondary | ICD-10-CM

## 2018-12-22 DIAGNOSIS — D62 Acute posthemorrhagic anemia: Secondary | ICD-10-CM | POA: Diagnosis not present

## 2018-12-22 DIAGNOSIS — O9081 Anemia of the puerperium: Secondary | ICD-10-CM | POA: Diagnosis not present

## 2018-12-22 DIAGNOSIS — Z3A37 37 weeks gestation of pregnancy: Secondary | ICD-10-CM | POA: Diagnosis not present

## 2018-12-22 DIAGNOSIS — O99324 Drug use complicating childbirth: Secondary | ICD-10-CM | POA: Diagnosis not present

## 2018-12-22 DIAGNOSIS — O365930 Maternal care for other known or suspected poor fetal growth, third trimester, not applicable or unspecified: Principal | ICD-10-CM | POA: Diagnosis present

## 2018-12-22 DIAGNOSIS — O99214 Obesity complicating childbirth: Secondary | ICD-10-CM | POA: Diagnosis present

## 2018-12-22 DIAGNOSIS — O26893 Other specified pregnancy related conditions, third trimester: Secondary | ICD-10-CM | POA: Diagnosis not present

## 2018-12-22 LAB — URINE DRUG SCREEN, QUALITATIVE (ARMC ONLY)
Amphetamines, Ur Screen: NOT DETECTED
Barbiturates, Ur Screen: NOT DETECTED
Benzodiazepine, Ur Scrn: NOT DETECTED
Cannabinoid 50 Ng, Ur ~~LOC~~: POSITIVE — AB
Cocaine Metabolite,Ur ~~LOC~~: NOT DETECTED
MDMA (Ecstasy)Ur Screen: NOT DETECTED
Methadone Scn, Ur: NOT DETECTED
Opiate, Ur Screen: NOT DETECTED
Phencyclidine (PCP) Ur S: NOT DETECTED
Tricyclic, Ur Screen: NOT DETECTED

## 2018-12-22 LAB — CBC
HCT: 36.1 % (ref 36.0–46.0)
Hemoglobin: 11.9 g/dL — ABNORMAL LOW (ref 12.0–15.0)
MCH: 27.4 pg (ref 26.0–34.0)
MCHC: 33 g/dL (ref 30.0–36.0)
MCV: 83.2 fL (ref 80.0–100.0)
Platelets: 315 10*3/uL (ref 150–400)
RBC: 4.34 MIL/uL (ref 3.87–5.11)
RDW: 13.5 % (ref 11.5–15.5)
WBC: 11.6 10*3/uL — ABNORMAL HIGH (ref 4.0–10.5)
nRBC: 0 % (ref 0.0–0.2)

## 2018-12-22 LAB — GLUCOSE, CAPILLARY
Glucose-Capillary: 80 mg/dL (ref 70–99)
Glucose-Capillary: 72 mg/dL (ref 70–99)
Glucose-Capillary: 84 mg/dL (ref 70–99)

## 2018-12-22 MED ORDER — BENZOCAINE-MENTHOL 20-0.5 % EX AERO: 1.0000 "application " | INHALATION_SPRAY | CUTANEOUS | Status: DC | PRN

## 2018-12-22 MED ORDER — EPHEDRINE 5 MG/ML INJ
10.0000 mg | INTRAVENOUS | Status: DC | PRN
  Filled 2018-12-22: qty 2

## 2018-12-22 MED ORDER — AMMONIA AROMATIC IN INHA
RESPIRATORY_TRACT | Status: AC
  Filled 2018-12-22: qty 10

## 2018-12-22 MED ORDER — LIDOCAINE HCL (PF) 1 % IJ SOLN
30.0000 mL | INTRAMUSCULAR | Status: AC | PRN
  Administered 2018-12-22: 30 mL via SUBCUTANEOUS

## 2018-12-22 MED ORDER — BENZOCAINE-MENTHOL 20-0.5 % EX AERO
INHALATION_SPRAY | CUTANEOUS | Status: AC
  Administered 2018-12-22: 22:00:00
  Filled 2018-12-22: qty 56

## 2018-12-22 MED ORDER — OXYTOCIN 10 UNIT/ML IJ SOLN
INTRAMUSCULAR | Status: AC
  Filled 2018-12-22: qty 2

## 2018-12-22 MED ORDER — DIPHENHYDRAMINE HCL 25 MG PO CAPS
25.0000 mg | ORAL_CAPSULE | Freq: Once | ORAL | Status: AC
  Administered 2018-12-22: 25 mg via ORAL

## 2018-12-22 MED ORDER — TERBUTALINE SULFATE 1 MG/ML IJ SOLN: 0.2500 mg | Freq: Once | INTRAMUSCULAR | Status: DC | PRN

## 2018-12-22 MED ORDER — ONDANSETRON HCL 4 MG/2ML IJ SOLN
4.0000 mg | Freq: Four times a day (QID) | INTRAMUSCULAR | Status: DC | PRN
  Administered 2018-12-22: 13:00:00 4 mg via INTRAVENOUS
  Filled 2018-12-22: qty 2

## 2018-12-22 MED ORDER — PHENYLEPHRINE 40 MCG/ML (10ML) SYRINGE FOR IV PUSH (FOR BLOOD PRESSURE SUPPORT)
80.0000 ug | PREFILLED_SYRINGE | INTRAVENOUS | Status: DC | PRN
  Filled 2018-12-22: qty 10

## 2018-12-22 MED ORDER — LIDOCAINE-EPINEPHRINE (PF) 1.5 %-1:200000 IJ SOLN
INTRAMUSCULAR | Status: DC | PRN
  Administered 2018-12-22: 3 mL via PERINEURAL

## 2018-12-22 MED ORDER — OXYTOCIN BOLUS FROM INFUSION
500.0000 mL | Freq: Once | INTRAVENOUS | Status: AC
  Administered 2018-12-22: 22:00:00 500 mL via INTRAVENOUS

## 2018-12-22 MED ORDER — DIPHENHYDRAMINE HCL 50 MG/ML IJ SOLN: 12.5000 mg | INTRAMUSCULAR | Status: DC | PRN

## 2018-12-22 MED ORDER — FENTANYL 2.5 MCG/ML W/ROPIVACAINE 0.15% IN NS 100 ML EPIDURAL (ARMC)
12.0000 mL/h | EPIDURAL | Status: DC
  Administered 2018-12-22: 12 mL/h via EPIDURAL

## 2018-12-22 MED ORDER — OXYTOCIN 40 UNITS IN NORMAL SALINE INFUSION - SIMPLE MED
2.5000 [IU]/h | INTRAVENOUS | Status: DC
  Filled 2018-12-22: qty 1000

## 2018-12-22 MED ORDER — OXYTOCIN 40 UNITS IN NORMAL SALINE INFUSION - SIMPLE MED
1.0000 m[IU]/min | INTRAVENOUS | Status: DC
  Administered 2018-12-22: 15:00:00 2 m[IU]/min via INTRAVENOUS

## 2018-12-22 MED ORDER — LACTATED RINGERS IV SOLN: 500.0000 mL | INTRAVENOUS | Status: DC | PRN

## 2018-12-22 MED ORDER — ACETAMINOPHEN 325 MG PO TABS: 650.0000 mg | ORAL_TABLET | ORAL | Status: DC | PRN

## 2018-12-22 MED ORDER — FENTANYL CITRATE (PF) 100 MCG/2ML IJ SOLN: 50.0000 ug | INTRAMUSCULAR | Status: DC | PRN

## 2018-12-22 MED ORDER — MISOPROSTOL 200 MCG PO TABS
ORAL_TABLET | ORAL | Status: AC
  Filled 2018-12-22: qty 4

## 2018-12-22 MED ORDER — IBUPROFEN 600 MG PO TABS
600.0000 mg | ORAL_TABLET | Freq: Four times a day (QID) | ORAL | Status: DC
  Administered 2018-12-23: 600 mg via ORAL
  Filled 2018-12-22: qty 1

## 2018-12-22 MED ORDER — LIDOCAINE HCL (PF) 1 % IJ SOLN
INTRAMUSCULAR | Status: AC
  Filled 2018-12-22: qty 30

## 2018-12-22 MED ORDER — FENTANYL 2.5 MCG/ML W/ROPIVACAINE 0.15% IN NS 100 ML EPIDURAL (ARMC)
EPIDURAL | Status: AC
  Filled 2018-12-22: qty 100

## 2018-12-22 MED ORDER — DIPHENHYDRAMINE HCL 25 MG PO CAPS
ORAL_CAPSULE | ORAL | Status: AC
  Filled 2018-12-22: qty 1

## 2018-12-22 MED ORDER — LACTATED RINGERS IV SOLN
INTRAVENOUS | Status: DC
  Administered 2018-12-22: 1000 mL via INTRAVENOUS

## 2018-12-22 MED ORDER — FAMOTIDINE 20 MG PO TABS
20.0000 mg | ORAL_TABLET | Freq: Two times a day (BID) | ORAL | Status: DC | PRN
  Administered 2018-12-22: 10:00:00 20 mg via ORAL
  Filled 2018-12-22: qty 1

## 2018-12-22 MED ORDER — BUPIVACAINE HCL (PF) 0.25 % IJ SOLN
INTRAMUSCULAR | Status: DC | PRN
  Administered 2018-12-22: 8 mL via EPIDURAL

## 2018-12-22 MED ORDER — LIDOCAINE HCL (PF) 1 % IJ SOLN
INTRAMUSCULAR | Status: DC | PRN
  Administered 2018-12-22: 3 mL

## 2018-12-22 MED ORDER — LACTATED RINGERS IV SOLN: 500.0000 mL | Freq: Once | INTRAVENOUS | Status: DC

## 2018-12-22 MED ORDER — MISOPROSTOL 100 MCG PO TABS
25.0000 ug | ORAL_TABLET | ORAL | Status: DC | PRN
  Administered 2018-12-22: 09:00:00 25 ug via VAGINAL
  Filled 2018-12-22 (×3): qty 1

## 2018-12-22 NOTE — Progress Notes (Signed)
L&D Progress Note   S:Contractions had increased in intensity and had become very uncomfortable after SROM of clear fluid at 1836. Feeling more comfortable after an epidural.    O: BP (!) 105/51   Pulse 63   Temp 98.4 F (36.9 C) (Oral)   Resp 20   Ht 5\' 11"  (1.803 m)   Wt 125 kg   LMP 03/25/2018   SpO2 100%   BMI 38.43 kg/m   General: in NAD Cervix now 6/80%/-1 to 0 (progressed 2 cm in the last hour).  The Pitocin that was started at 1515 was stopped at 1933 due to fetal heart decelerations. She seemed to be having frequent contractions at the time. Had been having difficulty picking up contractions with toco and had patient marking strip when she felt a contraction. Variable deceleration became deeper and lasted longer. An IUPC was inserted and an amnioinfusion was begun. Turned from side to side but best position is supine. FHR currently 1405 baseline with moderate to marked variability and variable decelerations to 80s to 90s  IUPC: contractions every 2-5 minutes, with varying strength and duration off Pitocin  A: Cat 2 tracing  P: Continue amnioinfusion Monitor FHR and cervical progress closely Will not start Pitocin at this time as she is progressing  Dalia Heading, CNM

## 2018-12-22 NOTE — Discharge Instructions (Signed)
°Vaginal Delivery, Care After °Refer to this sheet in the next few weeks. These discharge instructions provide you with information on caring for yourself after delivery. Your caregiver may also give you specific instructions. Your treatment has been planned according to the most current medical practices available, but problems sometimes occur. Call your caregiver if you have any problems or questions after you go home. °HOME CARE INSTRUCTIONS °1. Take over-the-counter or prescription medicines only as directed by your caregiver or pharmacist. °2. Do not drink alcohol, especially if you are breastfeeding or taking medicine to relieve pain. °3. Do not smoke tobacco. °4. Continue to use good perineal care. Good perineal care includes: °1. Wiping your perineum from back to front °2. Keeping your perineum clean. °3. You can do sitz baths twice a day, to help keep this area clean °5. Do not use tampons, douche or have sex for 6 weeks °6. Shower only and avoid sitting in submerged water, aside from sitz baths °7. Wear a well-fitting bra that provides breast support. °8. Eat healthy foods. °9. Drink enough fluids to keep your urine clear or pale yellow. °10. Eat high-fiber foods such as whole grain cereals and breads, brown rice, beans, and fresh fruits and vegetables every day. These foods may help prevent or relieve constipation. °11. Avoid constipation with high fiber foods or medications, such as miralax or metamucil °12. Follow your caregiver's recommendations regarding resumption of activities such as climbing stairs, driving, lifting, exercising, or traveling. °13. Talk to your caregiver about resuming sexual activities. Resumption of sexual activities after 6 weeks is dependent upon your risk of infection, your rate of healing, and your comfort and desire to resume sexual activity. °14. Try to have someone help you with your household activities and your newborn for at least a few days after you leave the  hospital. °15. Rest as much as possible. Try to rest or take a nap when your newborn is sleeping. °16. Increase your activities gradually. °17. Keep all of your scheduled postpartum appointments. It is very important to keep your scheduled follow-up appointments. At these appointments, your caregiver will be checking to make sure that you are healing physically and emotionally. °SEEK MEDICAL CARE IF:  °· You are passing large clots from your vagina. Save any clots to show your caregiver. °· You have a foul smelling discharge from your vagina. °· You have trouble urinating. °· You are urinating frequently. °· You have pain when you urinate. °· You have a change in your bowel movements. °· You have increasing redness, pain, or swelling near your vaginal incision (episiotomy) or vaginal tear. °· You have pus draining from your episiotomy or vaginal tear. °· Your episiotomy or vaginal tear is separating. °· You have painful, hard, or reddened breasts. °· You have a severe headache. °· You have blurred vision or see spots. °· You feel sad or depressed. °· You have thoughts of hurting yourself or your newborn. °· You have questions about your care, the care of your newborn, or medicines. °· You are dizzy or light-headed. °· You have a rash. °· You have nausea or vomiting. °· You were breastfeeding and have not had a menstrual period within 12 weeks after you stopped breastfeeding. °· You are not breastfeeding and have not had a menstrual period by the 12th week after delivery. °· You have a fever of 100.5 or more °SEEK IMMEDIATE MEDICAL CARE IF:  °· You have persistent pain. °· You have chest pain. °· You have shortness   of breath. °· You faint. °· You have leg pain. °· You have stomach pain. °· Your vaginal bleeding saturates two or more sanitary pads in 1 hour. °MAKE SURE YOU:  °· Understand these instructions. °· Will watch your condition. °· Will get help right away if you are not doing well or get worse. °Document  Released: 01/28/2000 Document Revised: 06/16/2013 Document Reviewed: 09/27/2011 °ExitCare® Patient Information ©2015 [employer]. This information is not intended to replace advice given to you by your health care provider. Make sure you discuss any questions you have with your health care provider. ° °Sitz Bath °A sitz bath is a warm water bath taken in the sitting position. The water covers only the hips and butt (buttocks). We recommend using one that fits in the toilet, to help with ease of use and cleanliness. It may be used for either healing or cleaning purposes. Sitz baths are also used to relieve pain, itching, or muscle tightening (spasms). The water may contain medicine. Moist heat will help you heal and relax.  °HOME CARE  °Take 3 to 4 sitz baths a day. °18. Fill the bathtub half-full with warm water. °19. Sit in the water and open the drain a little. °20. Turn on the warm water to keep the tub half-full. Keep the water running constantly. °21. Soak in the water for 15 to 20 minutes. °22. After the sitz bath, pat the affected area dry. °GET HELP RIGHT AWAY IF: °You get worse instead of better. Stop the sitz baths if you get worse. °MAKE SURE YOU: °· Understand these instructions. °· Will watch your condition. °· Will get help right away if you are not doing well or get worse. °Document Released: 03/09/2004 Document Revised: 10/25/2011 Document Reviewed: 05/30/2010 °ExitCare® Patient Information ©2015 [employer]. This information is not intended to replace advice given to you by your health care provider. Make sure you discuss any questions you have with your health care provider. ° ° °

## 2018-12-22 NOTE — Anesthesia Preprocedure Evaluation (Signed)
Anesthesia Evaluation  Patient identified by MRN, date of birth, ID band Patient awake    Reviewed: Allergy & Precautions, NPO status , Patient's Chart, lab work & pertinent test results  Airway Mallampati: II       Dental   Pulmonary former smoker,    Pulmonary exam normal        Cardiovascular negative cardio ROS Normal cardiovascular exam     Neuro/Psych negative neurological ROS  negative psych ROS   GI/Hepatic negative GI ROS, Neg liver ROS,   Endo/Other  diabetes  Renal/GU negative Renal ROS  negative genitourinary   Musculoskeletal negative musculoskeletal ROS (+)   Abdominal Normal abdominal exam  (+)   Peds negative pediatric ROS (+)  Hematology negative hematology ROS (+)   Anesthesia Other Findings   Reproductive/Obstetrics (+) Pregnancy                             Anesthesia Physical Anesthesia Plan  ASA: II  Anesthesia Plan: Epidural   Post-op Pain Management:    Induction:   PONV Risk Score and Plan:   Airway Management Planned: Natural Airway  Additional Equipment:   Intra-op Plan:   Post-operative Plan:   Informed Consent: I have reviewed the patients History and Physical, chart, labs and discussed the procedure including the risks, benefits and alternatives for the proposed anesthesia with the patient or authorized representative who has indicated his/her understanding and acceptance.     Dental advisory given  Plan Discussed with: CRNA and Surgeon  Anesthesia Plan Comments:         Anesthesia Quick Evaluation

## 2018-12-22 NOTE — Discharge Summary (Signed)
Physician Obstetric Discharge Summary  Patient ID: Donna David MRN: [id number] DOB/AGE: July 26, 1990 28 y.o.  Date of Admission: 12/22/2018 Date of Delivery: 12/22/2018 Date of Discharge: 12/24/2018  Admitting Diagnosis: Induction of labor at [redacted]w[redacted]d Secondary Diagnosis: Gestational diabetes diet controlled (A1) and Intrauterine growth restriction  Mode of Delivery: Normal spontaneous vaginal delivery 12/22/2018      Discharge Diagnosis: Term intrauterine pregnancy-delivered, Intrauterine growth restriction. Gestational diabetes.    Intrapartum Procedures: epidural, pitocin augmentation, placement of fetal scalp electrode, placement of intrauterine catheter and Cytotec induction, repair of obstertric laceration, amnioinfusion   Post partum procedures: None  Complications: None  Brief Hospital Course  MMilla Wahlbergis a [id number] had a SVD on 12/22/2018;  for further details of this delivery, please refer to the delivery note.  Patient had an uncomplicated postpartum course.  By time of discharge on PPD#2, her pain was controlled on oral pain medications; she had appropriate lochia and was ambulating, voiding without difficulty and tolerating regular diet.  She was deemed stable for discharge to home.    Labs: CBC Latest Ref Rng & Units 12/23/2018 12/22/2018 10/31/2018  WBC 4.0 - 10.5 K/uL 14.5(H) 11.6(H) 10.2  Hemoglobin 12.0 - 15.0 g/dL 12.2 11.9(L) 13.1  Hematocrit 36.0 - 46.0 % 36.4 36.1 39.1  Platelets 150 - 400 K/uL 312 315 388   O POS  Physical exam:  Blood pressure 119/65, pulse 64, temperature 98.3 F (36.8 C), temperature source Oral, resp. rate 18, height '5\' 11"'$  (1.803 m), weight 125 kg, last menstrual period 03/25/2018, SpO2 99 %.  General: alert and no distress Lochia: appropriate Abdomen: soft, NT Uterine Fundus: firm Extremities: No evidence of DVT seen on physical exam.   Discharge Instructions: Per After Visit Summary. Activity: Advance as tolerated. Pelvic rest  for 6 weeks.  Also refer to Discharge Instructions Diet: Regular  Medications: Allergies as of 12/24/2018   No Known Allergies     Medication List    STOP taking these medications   Accu-Chek Guide w/Device Kit   glucose blood test strip   Lancets 30G Misc     TAKE these medications   multivitamin-prenatal 27-0.8 MG Tabs tablet Take 1 tablet by mouth daily at 12 noon.      Outpatient follow up:  Follow-up Information    GDalia Heading CNM. Schedule an appointment as soon as possible for a visit.   Specialty: Certified Nurse Midwife Why: for a 6 week postpartum check and Nexplanon insertion Contact information: [phone]         Postpartum contraception: Nexplanon  Discharged Condition: good  Discharged to: home  Newborn Data: Baby girl Clear Disposition:home with mother  Apgars: APGAR (1 MIN): 9   APGAR (5 MINS): 9    Baby Feeding: [place] CNM 12/24/2018 11:50 AM

## 2018-12-22 NOTE — H&P (Addendum)
OB History & Physical   History of Present Illness:  Chief Complaint:  "I am here to have my baby." HPI:  Donna David is a 28 y.o. G3P1001 female with EDC=01/11/2019 at 53w1ddated by an 18wk3d ultrasound.  Her pregnancy has been complicated by obesity (current BMI 36.54 kg/m2), +CHlamydia infection with negative TOC, Breastpresumed GDMA1 (had an elevated early glucola and an elevated 28 week glucola-140/160 and refused a 3 hour GTT), a hx of a third degree laceration with her first delivery, and more recently fetal growth restriction. Antepartum testing has been reassuring with normal AFIs, BPPs and NSTs. The last EFW on 10/29 was 4#10oz or <3rd%).   She presents to L&D for an induction of labor for the fetal growth restriction. She reports some BH contractions, but no vaginal bleeding or leakage of fluid.  Baby is active.   [facility]  [facility] P[facility] Labs  Dating 18 week [place] type: O/Positive/-- (06/02 1146)   Genetic Screen 1 Screen:    AFP:     Quad:     NIPS: Antibody:Negative (06/02 1146)  Anatomic [place] Rubella: 1.84 (06/02 1146) Varicella: Immune  GTT Early: 140  Third trimester: 160 RPR: Non Reactive (06/02 1146)   Rhogam NA HBsAg: Negative (06/02 1146)   TDaP vaccine        declined               Flu Shot: declined HIV: Non Reactive (06/02 1146)   Baby Food        Breast                        GBS: negative  Contraception Nexplanon Pap: 07/16/2018, NILM  CBB     CS/VBAC    Support Person             Maternal Medical History:   Past Medical History:  Diagnosis Date  . Obesity (BMI 35.0-39.9 without comorbidity)   . Third degree perineal laceration    with G1    Past Surgical History:  Procedure Laterality Date  . Repair of third degree perineal laceration  12/2013  . TONSILLECTOMY      No Known Allergies  Prior to Admission medications   Medication Sig Start Date  End Date Taking? Authorizing Provider  Blood Glucose Monitoring Suppl (ACCU-CHEK GUIDE) w/Device KIT 1 each by Does not apply route 4 (four) times daily. 12/04/18   SAdrian ProwsR, MD  glucose blood test strip Use as directed 12/18/18   SRexene Agent CNM  Lancets 30G MISC Check blood sugar 4 times daily, fasting and 2 hours after each meal 12/12/18   GRod Can CNM  Prenatal Vit-Fe Fumarate-FA (MULTIVITAMIN-PRENATAL) 27-0.8 MG TABS tablet Take 1 tablet by mouth daily at 12 noon.    [provider]          Social History: She  reports that she has quit smoking. She has never used smokeless tobacco. She reports previous alcohol use. She reports that she does not use drugs.  Family History: family history includes Autism in her cousin; Cancer in her maternal great-grandfather and maternal great-grandmother; Diabetes in her paternal grandfather; Heart attack in her maternal grandfather and paternal grandfather; Heart disease in her maternal grandfather; Hypertension in her maternal grandfather, mother, paternal grandfather, and paternal uncle; Melanoma in her maternal grandmother; Stomach cancer in her paternal great-grandmother.  Review of Systems: Negative x 10 systems reviewed except as noted in the HPI.      Physical Exam:  Vital Signs: BP 120/67   Pulse 73   Temp 98.6 F (37 C) (Oral)   Resp 20   Wt 125 kg   LMP 03/25/2018   BMI 38.43 kg/m    General: gravid BF in no acute distress.  HEENT: normocephalic, atraumatic Heart: regular rate & rhythm.  No murmurs/rubs/gallops Lungs: clear to auscultation bilaterally Abdomen: soft, gravid, non-tender;  EFW: 5#/ LOP on ultrasound Pelvic:   External: Normal external female genitalia  Cervix: closed/ 40%/-2/posterior/ soft  Extremities: non-tender, symmetric, no edema bilaterally.  DTRs: +1  Neurologic: Alert & oriented x 3.   Baseline FHR: 135 to 140 baseline with accelerations to 150s, moderate  variability Toco: rare contraction  Results for orders placed or performed during the hospital encounter of 12/22/18 (from the past 24 hour(s))  CBC     Status: Abnormal   Collection Time: 12/22/18  8:58 AM  Result Value Ref Range   WBC 11.6 (H) 4.0 - 10.5 K/uL   RBC 4.34 3.87 - 5.11 MIL/uL   Hemoglobin 11.9 (L) 12.0 - 15.0 g/dL   HCT 36.1 36.0 - 46.0 %   MCV 83.2 80.0 - 100.0 fL   MCH 27.4 26.0 - 34.0 pg   MCHC 33.0 30.0 - 36.0 g/dL   RDW 13.5 11.5 - 15.5 %   Platelets 315 150 - 400 K/uL   nRBC 0.0 0.0 - 0.2 %  Type and screen     Status: None (Preliminary result)   Collection Time: 12/22/18  8:58 AM  Result Value Ref Range   ABO/RH(D) PENDING    Antibody Screen PENDING    Sample Expiration      12/25/2018,2359 Performed at [facility], [address] [place], [place] [address]   Glucose, capillary     Status: None   Collection Time: 12/22/18  9:15 AM  Result Value Ref Range   Glucose-Capillary 80 70 - 99 mg/dL   Assessment:  Donna David is a 28 y.o. G39P1001 female at 54w1dwith fetal growth restriction for induction of labor Bishop score is 4 FWB: Cat 1 GDMA1  Plan:  1. Admit to Labor & Delivery . Discussed risks of induction including hyperstimulation, fetal intolerance, failed induction and Cesarean section.  2. Due to Bishop score, will start with Cytotec. 25 mcg inserted PV. Monitor continuously. CBGs every 4 hours at this time 3. CBC, T&S, Clrs, IVF 4. GBS negative.   5. Consents obtained. 6. O POS/ RI/ VI 7. Breast 8. TDAP declined 9. Contraception: Nexplanon   CDalia Heading 12/22/2018 8:11 AM

## 2018-12-22 NOTE — Anesthesia Procedure Notes (Signed)
Epidural Patient location during procedure: OB Start time: 12/22/2018 6:51 PM End time: 12/22/2018 7:08 PM  Staffing Anesthesiologist: Alvin Critchley, MD Performed: anesthesiologist   Preanesthetic Checklist Completed: patient identified, site marked, surgical consent, pre-op evaluation, timeout performed, IV checked, risks and benefits discussed and monitors and equipment checked  Epidural Patient position: sitting Prep: Betadine Patient monitoring: heart rate, continuous pulse ox and blood pressure Approach: midline Location: L3-L4 Injection technique: LOR air  Needle:  Needle type: Tuohy  Needle gauge: 17 G Needle length: 9 cm and 9 Catheter type: closed end flexible Catheter size: 19 Gauge Test dose: negative and 1.5% lidocaine with Epi 1:200 K  Assessment Sensory level: T8 Events: blood not aspirated, injection not painful, no injection resistance, negative IV test and no paresthesia  Additional Notes Time out called.  Patient placed in sitting position.  Back prepped and draped in sterile fashion.  A skin wheal was made in the L3-L4 interspace with 1% Lidocaine plain.  A 17G Tuohy needle was advanced into the epidural space by a loss of resistance technique.  The epidural catheter was threaded 3cm and the TD was negative.  No blood, fluid or paresthesias.  The patient tolerated the procedure well and the catheter was affixed to the back in sterile fashion.Reason for block:procedure for pain

## 2018-12-23 LAB — CBC
HCT: 36.4 % (ref 36.0–46.0)
Hemoglobin: 12.2 g/dL (ref 12.0–15.0)
MCH: 27.4 pg (ref 26.0–34.0)
MCHC: 33.5 g/dL (ref 30.0–36.0)
MCV: 81.8 fL (ref 80.0–100.0)
Platelets: 312 10*3/uL (ref 150–400)
RBC: 4.45 MIL/uL (ref 3.87–5.11)
RDW: 13.5 % (ref 11.5–15.5)
WBC: 14.5 10*3/uL — ABNORMAL HIGH (ref 4.0–10.5)
nRBC: 0 % (ref 0.0–0.2)

## 2018-12-23 LAB — RPR: RPR Ser Ql: NONREACTIVE

## 2018-12-23 MED ORDER — DIBUCAINE (PERIANAL) 1 % EX OINT: 1.0000 "application " | TOPICAL_OINTMENT | CUTANEOUS | Status: DC | PRN

## 2018-12-23 MED ORDER — FERROUS SULFATE 325 (65 FE) MG PO TABS
325.0000 mg | ORAL_TABLET | Freq: Every day | ORAL | Status: DC
  Administered 2018-12-23 – 2018-12-24 (×2): 325 mg via ORAL
  Filled 2018-12-23 (×2): qty 1

## 2018-12-23 MED ORDER — ONDANSETRON HCL 4 MG PO TABS: 4.0000 mg | ORAL_TABLET | ORAL | Status: DC | PRN

## 2018-12-23 MED ORDER — SENNOSIDES-DOCUSATE SODIUM 8.6-50 MG PO TABS
2.0000 | ORAL_TABLET | ORAL | Status: DC
  Administered 2018-12-23 – 2018-12-24 (×2): 2 via ORAL
  Filled 2018-12-23 (×2): qty 2

## 2018-12-23 MED ORDER — IBUPROFEN 600 MG PO TABS
600.0000 mg | ORAL_TABLET | Freq: Four times a day (QID) | ORAL | Status: DC
  Administered 2018-12-23 – 2018-12-24 (×2): 600 mg via ORAL
  Filled 2018-12-23 (×3): qty 1

## 2018-12-23 MED ORDER — COCONUT OIL OIL
1.0000 "application " | TOPICAL_OIL | Status: DC | PRN
  Filled 2018-12-23: qty 120

## 2018-12-23 MED ORDER — ACETAMINOPHEN 500 MG PO TABS
1000.0000 mg | ORAL_TABLET | Freq: Four times a day (QID) | ORAL | Status: DC | PRN
  Administered 2018-12-23: 1000 mg via ORAL
  Filled 2018-12-23 (×2): qty 2

## 2018-12-23 MED ORDER — TETANUS-DIPHTH-ACELL PERTUSSIS 5-2.5-18.5 LF-MCG/0.5 IM SUSP: 0.5000 mL | Freq: Once | INTRAMUSCULAR | Status: DC

## 2018-12-23 MED ORDER — PRENATAL MULTIVITAMIN CH
1.0000 | ORAL_TABLET | Freq: Every day | ORAL | Status: DC
  Administered 2018-12-23: 16:00:00 1 via ORAL
  Filled 2018-12-23: qty 1

## 2018-12-23 MED ORDER — IBUPROFEN 600 MG PO TABS
600.0000 mg | ORAL_TABLET | Freq: Four times a day (QID) | ORAL | Status: DC
  Administered 2018-12-23 (×2): 600 mg via ORAL
  Filled 2018-12-23 (×2): qty 1

## 2018-12-23 MED ORDER — WITCH HAZEL-GLYCERIN EX PADS: 1.0000 "application " | MEDICATED_PAD | CUTANEOUS | Status: DC | PRN

## 2018-12-23 MED ORDER — SIMETHICONE 80 MG PO CHEW: 80.0000 mg | CHEWABLE_TABLET | ORAL | Status: DC | PRN

## 2018-12-23 MED ORDER — ONDANSETRON HCL 4 MG/2ML IJ SOLN: 4.0000 mg | INTRAMUSCULAR | Status: DC | PRN

## 2018-12-23 NOTE — Anesthesia Postprocedure Evaluation (Signed)
Anesthesia Post Note  Patient: Donna David  Procedure(s) Performed: AN AD HOC LABOR EPIDURAL  Patient location during evaluation: Mother Baby Anesthesia Type: Epidural Level of consciousness: awake and alert Pain management: pain level controlled Vital Signs Assessment: post-procedure vital signs reviewed and stable Respiratory status: spontaneous breathing, nonlabored ventilation and respiratory function stable Cardiovascular status: stable Postop Assessment: no headache, no backache and epidural receding Anesthetic complications: no     Last Vitals:  Vitals:   12/23/18 0203 12/23/18 0420  BP: (!) 108/51 121/68  Pulse: 68 90  Resp: 20 18  Temp: 36.9 C 36.9 C  SpO2: 99% 97%    Last Pain:  Vitals:   12/23/18 0420  TempSrc: Oral  PainSc:                  Hedda Slade

## 2018-12-23 NOTE — Lactation Note (Signed)
This note was copied from a baby's chart. Lactation Consultation Note  Patient Name: Donna David [id number] Date: 12/23/2018   Mom does not want to put Clear to the breast right now reporting it cramps too much when she goes to the breast.  Mom is willing to hand express and give via bottle.  Mom has a Symphony pump in the room that she says she owns.  She reports it was given to her by a neighbor.  The label on the pump says it is property of the health department.  She offered to sell it to me.  I do not know how to handle this situation since I do not know the name of the person that it was originally loaned out to from the Health Department.  Reviewed normal newborn stomach size and supply and demand and encouraged mom to keep stimulating the breast and give whatever she expresses to Clear.  Explained the best way and the easiest way to give her milk was directly from the breast.  Reviewed feeding cues, normal course of lactation and routine newborn feeding patterns.  Lactation name and number written on the white board and encouraged mom to call with any questions, concerns or assistance.  Maternal Data    Feeding Feeding Type: Breast Milk with Formula added Nipple Type: Slow - flow  LATCH Score                   Interventions    Lactation Tools Discussed/Used     Consult Status      Donna David 12/23/2018, 10:45 PM

## 2018-12-23 NOTE — Progress Notes (Signed)
Subjective:  Doing well postpartum day 1: She is tolerating regular diet. Her pain is controlled with PO medication. She is ambulating and voiding without difficulty. She has attempted breastfeeding and has also given formula. Encouraged continued breastfeeding and lactation evaluation.   Objective:  Vital signs in last 24 hours: Temp:  [98.4 F (36.9 C)-98.9 F (37.2 C)] 98.5 F (36.9 C) (11/09 1540) Pulse Rate:  [55-108] 76 (11/09 0812) Resp:  [18-20] 20 (11/09 0812) BP: (94-137)/(51-99) 117/74 (11/09 0812) SpO2:  [97 %-100 %] 98 % (11/09 0812)    General: NAD Pulmonary: no increased work of breathing Abdomen: non-distended, non-tender, fundus firm at level of umbilicus Extremities: no edema, no erythema, no tenderness  Results for orders placed or performed during the hospital encounter of 12/22/18 (from the past 72 hour(s))  CBC     Status: Abnormal   Collection Time: 12/22/18  8:58 AM  Result Value Ref Range   WBC 11.6 (H) 4.0 - 10.5 K/uL   RBC 4.34 3.87 - 5.11 MIL/uL   Hemoglobin 11.9 (L) 12.0 - 15.0 g/dL   HCT 08.6 76.1 - 95.0 %   MCV 83.2 80.0 - 100.0 fL   MCH 27.4 26.0 - 34.0 pg   MCHC 33.0 30.0 - 36.0 g/dL   RDW 93.2 67.1 - 24.5 %   Platelets 315 150 - 400 K/uL   nRBC 0.0 0.0 - 0.2 %    Comment: Performed at [facility], [address] [place], [place] [address]  RPR     Status: None   Collection Time: 12/22/18  8:58 AM  Result Value Ref Range   RPR Ser Ql NON REACTIVE NON REACTIVE    Comment: Performed at [facility], [address], [place], [place] [address]  Type and screen     Status: None (Preliminary result)   Collection Time: 12/22/18  8:58 AM  Result Value Ref Range   ABO/RH(D) O POS    Antibody Screen POS    Sample Expiration 12/25/2018,2359    Antibody Identification ANTI E    PT AG Type      POSITIVE FOR c ANTIGEN Performed at [facility], [address], [place], [place] [address]    Unit Number [id number]     Blood Component Type RED CELLS,LR    Unit division 00    Status of Unit ALLOCATED    Transfusion Status OK TO TRANSFUSE    Crossmatch Result COMPATIBLE    Unit Number [id number]    Blood Component Type RED CELLS,LR    Unit division 00    Status of Unit ALLOCATED    Transfusion Status OK TO TRANSFUSE    Crossmatch Result COMPATIBLE   Glucose, capillary     Status: None   Collection Time: 12/22/18  9:15 AM  Result Value Ref Range   Glucose-Capillary 80 70 - 99 mg/dL  Glucose, capillary     Status: None   Collection Time: 12/22/18  1:17 PM  Result Value Ref Range   Glucose-Capillary 72 70 - 99 mg/dL  Glucose, capillary     Status: None   Collection Time: 12/22/18  6:16 PM  Result Value Ref Range   Glucose-Capillary 84 70 - 99 mg/dL  Urine Drug Screen, Qualitative ([facility] only)     Status: Abnormal   Collection Time: 12/22/18  8:49 PM  Result Value Ref Range   Tricyclic, Ur Screen NONE DETECTED NONE DETECTED   Amphetamines, Ur Screen NONE DETECTED NONE DETECTED  MDMA (Ecstasy)Ur Screen NONE DETECTED NONE DETECTED   Cocaine Metabolite,Ur [place] NONE DETECTED NONE DETECTED   Opiate, Ur Screen NONE DETECTED NONE DETECTED   Phencyclidine (PCP) Ur S NONE DETECTED NONE DETECTED   Cannabinoid 50 Ng, Ur [place] POSITIVE (A) NONE DETECTED   Barbiturates, Ur Screen NONE DETECTED NONE DETECTED   Benzodiazepine, Ur Scrn NONE DETECTED NONE DETECTED   Methadone Scn, Ur NONE DETECTED NONE DETECTED    Comment: (NOTE) Tricyclics + metabolites, urine    Cutoff 1000 ng/mL Amphetamines + metabolites, urine  Cutoff 1000 ng/mL MDMA (Ecstasy), urine              Cutoff 500 ng/mL Cocaine Metabolite, urine          Cutoff 300 ng/mL Opiate + metabolites, urine        Cutoff 300 ng/mL Phencyclidine (PCP), urine         Cutoff 25 ng/mL Cannabinoid, urine                 Cutoff 50 ng/mL Barbiturates + metabolites, urine  Cutoff 200 ng/mL Benzodiazepine, urine              Cutoff 200 ng/mL Methadone, urine                    Cutoff 300 ng/mL The urine drug screen provides only a preliminary, unconfirmed analytical test result and should not be used for non-medical purposes. Clinical consideration and professional judgment should be applied to any positive drug screen result due to possible interfering substances. A more specific alternate chemical method must be used in order to obtain a confirmed analytical result. Gas chromatography / mass spectrometry (GC/MS) is the preferred confirmat ory method. Performed at [facility], [address] [place], [place] [address]   CBC     Status: Abnormal   Collection Time: 12/23/18  5:41 AM  Result Value Ref Range   WBC 14.5 (H) 4.0 - 10.5 K/uL   RBC 4.45 3.87 - 5.11 MIL/uL   Hemoglobin 12.2 12.0 - 15.0 g/dL   HCT 36.4 36.0 - 46.0 %   MCV 81.8 80.0 - 100.0 fL   MCH 27.4 26.0 - 34.0 pg   MCHC 33.5 30.0 - 36.0 g/dL   RDW 13.5 11.5 - 15.5 %   Platelets 312 150 - 400 K/uL   nRBC 0.0 0.0 - 0.2 %    Comment: Performed at [facility], [address], [place], [place] [address]    Assessment:   28 y.o. [phone] postpartum day # 1, lactating  Plan:    1) Acute blood loss anemia - hemodynamically stable and asymptomatic - po ferrous sulfate  2) Blood Type --/--/O POS (11/08 7564) / Rubella 1.84 (06/02 1146) / Varicella Immune  3) TDAP status declines  4) Feeding plan breastfeeding/pumping & formula  5)  Education given regarding options for contraception, as well as compatibility with breast feeding if applicable.  Patient plans on Nexplanon for contraception.  6) Disposition: continue current care   Rod Can, [facility] 12/23/2018, 11:45 AM

## 2018-12-23 NOTE — Lactation Note (Addendum)
This note was copied from a baby's chart. Lactation Consultation Note  Patient Name: Donna David XIPJA'S Date: 12/23/2018 Reason for consult: Initial assessment;Early term 37-38.6wks P2, 13 hour ETI female infant. Infant had one void and one stool since birth. Mom's feeding choice at admission was breast and formula feeding. Mom has mostly been formula feeding but would like  to latch infant at breast.  Mom is active on the [facility] program in [facility] and she brought her [place] from home. [place] services gave mom DEBP attachments to fit her Medela DEBP. Per mom, she breastfeed her  14 year old son for 2 weeks. She did not breastfeed her son in the hospital,  but on day 3  when she arrived home with her friend's help,  she did breastfed her son for  2 weeks a short period of time. Mom attempted to latch infant on left breast using the foot ball hold, infant was reluctant to breastfeed at this time only held breast in mouth ( less than 1 minute). Mom taught back hand expression  and infant was given 7 ml of colostrum by spoon.  Mom knows to use DEBP every 3 hours, both breast for 15 minutes on initial setting. As [place] left room mom was still using DEBP and colostrum was present in breast flanges.  Mom knows to breastfeed infant according to hunger cues, 8 to 12 times within 24 hours and on demand. Mom knows to do STS as much as possible.  Mom shown how to use DEBP & how to disassemble, clean, & reassemble parts. Mom made aware of O/P services, breastfeeding support groups, community resources, and our phone # for post-discharge questions.  Mom's current feeding plan within 24 hours:  1. Mom will  latch infant to breast and if infant  is reluctant to latch she will hand express and give  infant back volume. 2. Mom will continue to work towards latching infant to breast. 3. Mom will continue  to do STS as much as possible.   Maternal Data Formula Feeding for Exclusion: Yes Reason for  exclusion: Mother's choice to formula and breast feed on admission Has patient been taught Hand Expression?: Yes Does the patient have breastfeeding experience prior to this delivery?: Yes  Feeding Feeding Type: Breast Fed Nipple Type: Slow - flow  LATCH Score Latch: Too sleepy or reluctant, no latch achieved, no sucking elicited.  Audible Swallowing: None  Type of Nipple: Everted at rest and after stimulation  Comfort (Breast/Nipple): Soft / non-tender  Hold (Positioning): Assistance needed to correctly position infant at breast and maintain latch.  LATCH Score: 5  Interventions Interventions: Breast feeding basics reviewed;Breast compression;Assisted with latch;Adjust position;Skin to skin;Support pillows;DEBP;Hand pump;Breast massage;Position options;Hand express;Expressed milk  Lactation Tools Discussed/Used WIC Program: Yes Pump Review: Setup, frequency, and cleaning Initiated by:: Vicente Serene, IBCLC Date initiated:: 12/23/18   Consult Status Consult Status: Follow-up Date: 12/24/18 Follow-up type: In-patient    Vicente Serene 12/23/2018, 11:21 AM

## 2018-12-23 NOTE — Clinical Social Work Maternal (Signed)
CLINICAL SOCIAL WORK MATERNAL/CHILD NOTE  Patient Details  Name: Denelle Capurro MRN: [id number] Date of Birth: 1990/03/26  Date:  12/23/2018  Clinical Social Worker Initiating Note:  [employer], LCSW Date/Time: Initiated:  12/23/18/1230     Child's Name:  Clear Watlington   Biological Parents:  Mother, Father   Need for Interpreter:  None   Reason for Referral:  Current Substance Use/Substance Use During Pregnancy    Address:  [address] [place] [address]    Phone number:  [phone] (home)     Additional phone number:   Household Members/Support Persons (HM/SP):   Household Member/Support Person 1   HM/SP Name Relationship DOB or Age  HM/SP -1 Bernardo Watlington Significant other    HM/SP -2        HM/SP -3        HM/SP -4        HM/SP -5        HM/SP -6        HM/SP -7        HM/SP -8          Natural Supports (not living in the home):  Parent   Professional Supports:     Employment: Ship broker   Type of Work:     Education:  Attending college   Homebound arranged:    Museum/gallery curator Resources:  Medicaid   Other Resources:      Cultural/Religious Considerations Which May Impact Care:    Strengths:  Ability to meet basic needs    Psychotropic Medications:         Pediatrician:       Pediatrician List:   [place]      Pediatrician Fax Number:    Risk Factors/Current Problems:  Substance Use    Cognitive State:  Able to Concentrate    Mood/Affect:  Happy , Calm    CSW Assessment: Clinical Education officer, museum (CSW) received consult for drug exposed newborn. Mother's urine drug screen was positive for marijuana on admission. Infant's urine drug screen was positive for marijuana. Infant's cord tissue screen is pending. CSW met with mother and infant. Maternal grandmother Maudry Mayhew was at bedside. CSW asked maternal grandmother to leave  the room during assessment. Mother asked maternal grandmother to stay in the room and reported that she knows about the marijuana use. CSW introduced self and explained role of CSW department. Per mother she is in nursing school at [employer] and lives in [place] with her significant other Vicki Mallet and their 90 y.o son. Per mother this is her second baby. Mother reported that she has all the supplies needed for infant including a new car seat. CSW provided safe sleep education and provided mother with safe sleep handout. Mother reported that she is feeling fine and has no depression symptoms. Mother reported that she is not having thoughts about hurting herself or anybody else. CSW provided postpartum mood disorders education and provided mother with postpartum mood disorder check list. Mother reported that she used marijuana during her pregnancy to help with her nausea. Mother denied using other drugs and denied using alcohol. CSW provided mother with a list of [place] mental health and substance abuse treatment options. Mother reported that she does not feel her marijuana use is a problem. CSW made mother aware that a child protective services (CPS) report will have  to be made due to infant's positive urine drug screen. Mother verbalized her understanding.   CSW made an [place] report today. CPS accepted the case and assigned CPS worker is [employer] [phone]. CSW will continue to follow and assist as needed.    CSW Plan/Description:  Child Protective Service Report     Levada Bowersox, Lenice Llamas 12/23/2018, 5:01 PM

## 2018-12-24 LAB — SURGICAL PATHOLOGY

## 2018-12-24 NOTE — Progress Notes (Signed)
DC inst reviewed and pt verb u/o.  Escorted to Car via [place].

## 2018-12-25 LAB — TYPE AND SCREEN
ABO/RH(D): O POS
Antibody Screen: POSITIVE
PT AG Type: POSITIVE
Unit division: 0
Unit division: 0

## 2018-12-25 LAB — BPAM RBC
Blood Product Expiration Date: 202012032359
Blood Product Expiration Date: 202012032359
Unit Type and Rh: 5100
Unit Type and Rh: 5100

## 2018-12-30 ENCOUNTER — Other Ambulatory Visit: Payer: Self-pay

## 2018-12-30 ENCOUNTER — Encounter: Payer: Self-pay | Admitting: Intensive Care

## 2018-12-30 ENCOUNTER — Emergency Department
Admission: EM | Admit: 2018-12-30 | Discharge: 2018-12-30 | Disposition: A | Discharge status: Home / Self Care | Payer: Medicaid Other | Attending: Emergency Medicine | Admitting: Emergency Medicine

## 2018-12-30 DIAGNOSIS — F17210 Nicotine dependence, cigarettes, uncomplicated: Secondary | ICD-10-CM | POA: Insufficient documentation

## 2018-12-30 DIAGNOSIS — R102 Pelvic and perineal pain: Secondary | ICD-10-CM | POA: Diagnosis not present

## 2018-12-30 DIAGNOSIS — O9089 Other complications of the puerperium, not elsewhere classified: Secondary | ICD-10-CM | POA: Diagnosis not present

## 2018-12-30 DIAGNOSIS — G8918 Other acute postprocedural pain: Secondary | ICD-10-CM | POA: Diagnosis not present

## 2018-12-30 DIAGNOSIS — F1210 Cannabis abuse, uncomplicated: Secondary | ICD-10-CM | POA: Insufficient documentation

## 2018-12-30 LAB — CBC
HCT: 40.3 % (ref 36.0–46.0)
Hemoglobin: 12.7 g/dL (ref 12.0–15.0)
MCH: 26.8 pg (ref 26.0–34.0)
MCHC: 31.5 g/dL (ref 30.0–36.0)
MCV: 85 fL (ref 80.0–100.0)
Platelets: 462 10*3/uL — ABNORMAL HIGH (ref 150–400)
RBC: 4.74 MIL/uL (ref 3.87–5.11)
RDW: 13.5 % (ref 11.5–15.5)
WBC: 7.7 10*3/uL (ref 4.0–10.5)
nRBC: 0 % (ref 0.0–0.2)

## 2018-12-30 LAB — COMPREHENSIVE METABOLIC PANEL
ALT: 12 U/L (ref 0–44)
AST: 15 U/L (ref 15–41)
Albumin: 3.4 g/dL — ABNORMAL LOW (ref 3.5–5.0)
Alkaline Phosphatase: 63 U/L (ref 38–126)
Anion gap: 11 (ref 5–15)
BUN: 9 mg/dL (ref 6–20)
CO2: 22 mmol/L (ref 22–32)
Calcium: 9.4 mg/dL (ref 8.9–10.3)
Chloride: 107 mmol/L (ref 98–111)
Creatinine, Ser: 0.75 mg/dL (ref 0.44–1.00)
GFR calc Af Amer: >60 mL/min (ref >60)
GFR calc non Af Amer: >60 mL/min (ref >60)
Glucose, Bld: 89 mg/dL (ref 70–99)
Potassium: 3.8 mmol/L (ref 3.5–5.1)
Sodium: 140 mmol/L (ref 135–145)
Total Bilirubin: 0.5 mg/dL (ref 0.3–1.2)
Total Protein: 7.1 g/dL (ref 6.5–8.1)

## 2018-12-30 NOTE — ED Triage Notes (Signed)
Patient reports having vaginal delivery on 12/22/18 and her stitches have popped loose today and now experiencing bleeding from site.

## 2018-12-30 NOTE — ED Provider Notes (Signed)
[facility] Emergency Department Provider Note  Time seen: 7:48 PM  I have reviewed the triage vital signs and the nursing notes.   HISTORY  Chief Complaint Post-op Problem   HPI Donna David is a 28 y.o. female with a past medical history of obesity, vaginal laceration status post vaginal delivery 8 days ago presents to the emergency department for possible stitch rupture and vaginal bleeding.  According to the patient after her vaginal delivery 8 days ago she had vaginal bleeding for approximately a week which seemed to stop yesterday.  Today she states she felt as if one of the vaginal stitches popped.  States she wiped and noticed some blood so she came to the emergency department for evaluation.  Patient denies any vaginal discomfort in fact states it seems to feel quite a bit better since that happened.  Denies any fever cough or shortness of breath.  Past Medical History:  Diagnosis Date  . Obesity (BMI 35.0-39.9 without comorbidity)   . Third degree perineal laceration    with G1    Patient Active Problem List   Diagnosis Date Noted  . Encounter for induction of labor 12/22/2018  . Variable fetal heart rate decelerations, delivered 12/22/2018  . Liveborn infant by vaginal delivery 12/22/2018  . Postpartum care following vaginal delivery 12/22/2018  . Indication for care in labor and delivery, antepartum 12/19/2018  . IUGR (intrauterine growth restriction) affecting care of mother 12/19/2018  . Diet controlled gestational diabetes mellitus (GDM) in third trimester 12/18/2018  . Chlamydia infection complicating pregnancy 27/04/5007  . Abnormal oral glucose tolerance test 09/10/2018  . Supervision of high risk pregnancy, antepartum 07/16/2018  . Obesity in pregnancy 07/16/2018  . Obesity (BMI 35.0-39.9 without comorbidity) 07/16/2018  . History of third degree perineal laceration 07/16/2018    Past Surgical History:  Procedure Laterality Date  .  Repair of third degree perineal laceration  12/2013  . TONSILLECTOMY      Prior to Admission medications   Medication Sig Start Date End Date Taking? Authorizing Provider  Prenatal Vit-Fe Fumarate-FA (MULTIVITAMIN-PRENATAL) 27-0.8 MG TABS tablet Take 1 tablet by mouth daily at 12 noon.    [provider]    No Known Allergies  Family History  Problem Relation Age of Onset  . Hypertension Mother   . Melanoma Maternal Grandmother   . Heart attack Maternal Grandfather   . Hypertension Maternal Grandfather   . Heart disease Maternal Grandfather   . Cancer Maternal Great-grandfather   . Heart attack Paternal Grandfather   . Hypertension Paternal Grandfather   . Diabetes Paternal Grandfather   . Hypertension Paternal Uncle   . Autism Cousin   . Stomach cancer Paternal Great-grandmother   . Cancer Maternal Great-grandmother     Social History Social History   Tobacco Use  . Smoking status: Current Every Day Smoker    Types: Cigarettes  . Smokeless tobacco: Never Used  Substance Use Topics  . Alcohol use: Yes    Comment: occ  . Drug use: Not Currently    Types: Marijuana    Review of Systems Constitutional: Negative for fever. Cardiovascular: Negative for chest pain. Respiratory: Negative for shortness of breath. Gastrointestinal: Negative for abdominal pain Genitourinary: Mild vaginal bleeding Musculoskeletal: Negative for musculoskeletal complaints Neurological: Negative for headache All other ROS negative  ____________________________________________   PHYSICAL EXAM:  VITAL SIGNS: ED Triage Vitals [12/30/18 1555]  Enc Vitals Group     BP 133/89     Pulse Rate 71  Resp 14     Temp 98.5 F (36.9 C)     Temp Source Oral     SpO2 99 %     Weight 260 lb (117.9 kg)     Height 5\' 11"  (1.803 m)     Head Circumference      Peak Flow      Pain Score 0     Pain Loc      Pain Edu?      Excl. in GC?     Constitutional: Alert and oriented. Well  appearing and in no distress. Eyes: Normal exam ENT      Head: Normocephalic and atraumatic.      Mouth/Throat: Mucous membranes are moist. Cardiovascular: Normal rate, regular rhythm Respiratory: Normal respiratory effort without tachypnea nor retractions. Breath sounds are clear  Gastrointestinal: Soft and nontender. No distention Genitourinary: On vaginal exam patient appears to have approximately 4 what appeared to be absorbable sutures 1 of which does appear to have broken however there is no dehiscence of any wound, no bleeding from this area.  This is on the left vaginal wall there is no extension into the perineum. Musculoskeletal: Nontender with normal range of motion in all extremities.  Neurologic:  Normal speech and language. No gross focal neurologic deficits  Skin:  Skin is warm, dry and intact.  Psychiatric: Mood and affect are normal.     INITIAL IMPRESSION / ASSESSMENT AND PLAN / ED COURSE  Pertinent labs & imaging results that were available during my care of the patient were reviewed by me and considered in my medical decision making (see chart for details).   Patient presents to the emergency department after possibly rupturing one of the stitches from her vaginal laceration 8 days postpartum vaginal delivery.  On examination patient has sutures muscle which appear intact 1 which appears to have ruptured however there is no dehiscence of any wound there is no bleeding from this area.  I highly suspect the patient's blood that she saw earlier was likely continued vaginal bleeding which would be expected 8 days postpartum.  Denies any current bleeding.  No vaginal bleeding on my exam.  We will discharge patient with routine OB follow-up.  [name] was evaluated in Emergency Department on 12/30/2018 for the symptoms described in the history of present illness. She was evaluated in the context of the global COVID-19 pandemic, which necessitated consideration that the patient  might be at risk for infection with the SARS-CoV-2 virus that causes COVID-19. Institutional protocols and algorithms that pertain to the evaluation of patients at risk for COVID-19 are in a state of rapid change based on information released by regulatory bodies including the CDC and federal and state organizations. These policies and algorithms were followed during the patient's care in the ED.  ____________________________________________   FINAL CLINICAL IMPRESSION(S) / ED DIAGNOSES  Vaginal laceration   01/01/2019, MD 12/30/18 2029

## 2018-12-30 NOTE — Discharge Instructions (Addendum)
Please follow-up with your OB/GYN within the next 1 week for recheck/reevaluation.  Return to the emergency department for any pain, or any other symptom personally concerning to yourself.

## 2019-01-01 DIAGNOSIS — Z5181 Encounter for therapeutic drug level monitoring: Secondary | ICD-10-CM | POA: Diagnosis not present

## 2019-01-07 DIAGNOSIS — Z5181 Encounter for therapeutic drug level monitoring: Secondary | ICD-10-CM | POA: Diagnosis not present

## 2019-01-14 DIAGNOSIS — Z5181 Encounter for therapeutic drug level monitoring: Secondary | ICD-10-CM | POA: Diagnosis not present

## 2019-01-21 DIAGNOSIS — Z5181 Encounter for therapeutic drug level monitoring: Secondary | ICD-10-CM | POA: Diagnosis not present

## 2019-01-28 DIAGNOSIS — Z5181 Encounter for therapeutic drug level monitoring: Secondary | ICD-10-CM | POA: Diagnosis not present

## 2019-02-18 DIAGNOSIS — Z5181 Encounter for therapeutic drug level monitoring: Secondary | ICD-10-CM | POA: Diagnosis not present

## 2019-02-25 DIAGNOSIS — Z5181 Encounter for therapeutic drug level monitoring: Secondary | ICD-10-CM | POA: Diagnosis not present

## 2019-03-03 DIAGNOSIS — Z5181 Encounter for therapeutic drug level monitoring: Secondary | ICD-10-CM | POA: Diagnosis not present

## 2019-03-04 ENCOUNTER — Encounter: Payer: Self-pay | Admitting: Obstetrics & Gynecology

## 2019-03-04 ENCOUNTER — Ambulatory Visit (INDEPENDENT_AMBULATORY_CARE_PROVIDER_SITE_OTHER): Payer: Medicaid Other | Admitting: Obstetrics & Gynecology

## 2019-03-04 ENCOUNTER — Other Ambulatory Visit: Payer: Self-pay

## 2019-03-04 VITALS — BP 120/80 | Ht 71.0 in | Wt 250.0 lb

## 2019-03-04 DIAGNOSIS — Z1389 Encounter for screening for other disorder: Secondary | ICD-10-CM

## 2019-03-04 DIAGNOSIS — O24410 Gestational diabetes mellitus in pregnancy, diet controlled: Secondary | ICD-10-CM

## 2019-03-04 DIAGNOSIS — Z30017 Encounter for initial prescription of implantable subdermal contraceptive: Secondary | ICD-10-CM | POA: Diagnosis not present

## 2019-03-04 NOTE — Progress Notes (Signed)
  OBSTETRICS POSTPARTUM CLINIC PROGRESS NOTE  Subjective:     Donna David is a 29 y.o. G54P2002 female who presents for a postpartum visit. She is 6 weeks postpartum following a Term pregnancy and delivery by Vaginal, no problems at delivery.  I have fully reviewed the prenatal and intrapartum course. Anesthesia: local.  Postpartum course has been complicated by complicated by gestational diabetes.  Baby is feeding by Bottle.  Bleeding: patient has not  resumed menses.  Bowel function is normal. Bladder function is normal.  Patient is not sexually active. Contraception method desired is Nexplanon.  Postpartum depression screening: negative. Edinburgh 5.  The following portions of the patient's history were reviewed and updated as appropriate: allergies, current medications, past family history, past medical history, past social history, past surgical history and problem list.  Review of Systems Pertinent items are noted in HPI.  Objective:    BP 120/80   Ht 5\' 11"  (1.803 m)   Wt 250 lb (113.4 kg)   LMP 02/26/2019   BMI 34.87 kg/m   General:  alert and no distress   Breasts:  inspection negative, no nipple discharge or bleeding, no masses or nodularity palpable  Lungs: clear to auscultation bilaterally  Heart:  regular rate and rhythm, S1, S2 normal, no murmur, click, rub or gallop  Abdomen: soft, non-tender; bowel sounds normal; no masses,  no organomegaly.     Vulva:  normal  Vagina: normal vagina, no discharge, exudate, lesion, or erythema  Cervix:  no cervical motion tenderness and no lesions  Corpus: normal size, contour, position, consistency, mobility, non-tender  Adnexa:  normal adnexa and no mass, fullness, tenderness  Rectal Exam: Not performed.          Assessment:  Post Partum Care visit 1. Nexplanon insertion See below Alternatives discussed  2. Encounter for postpartum care and examination after delivery Doing well  3. Diet controlled gestational diabetes  mellitus (GDM), antepartum - Plan follow up testing - Glucose tolerance, 2 hours; Future   Plan:  See orders and Patient Instructions Follow up in: 6 months for annual/PAP or as needed.    Nexplanon Insertion  Patient given informed consent, signed copy in the chart, time out was performed. Appropriate time out taken.  Patient's LEFT arm was prepped and draped in the usual sterile fashion.. The ruler used to measure and mark insertion area.  Pt was prepped with betadine swab and then injected with 3 cc of 2% lidocaine with epinephrine. Nexplanon removed form packaging,  Device confirmed in needle, then inserted full length of needle and withdrawn per handbook instructions.  Pt insertion site covered with steri-strip and a bandage.   Minimal blood loss.  Pt tolerated the procedure well.   02/28/2019, MD, Annamarie Major Ob/Gyn, [facility] 03/04/2019  3:39 PM    03/06/2019, MD, Annamarie Major Ob/Gyn, [facility] 03/04/2019  3:39 PM

## 2019-03-04 NOTE — Patient Instructions (Signed)
Nexplanon Instructions After Insertion  Keep bandage clean and dry for 24 hours  May use ice/Tylenol/Ibuprofen for soreness or pain  If you develop fever, drainage or increased warmth from incision site-contact office immediately   

## 2019-03-10 DIAGNOSIS — Z5181 Encounter for therapeutic drug level monitoring: Secondary | ICD-10-CM | POA: Diagnosis not present

## 2019-03-18 DIAGNOSIS — Z5181 Encounter for therapeutic drug level monitoring: Secondary | ICD-10-CM | POA: Diagnosis not present

## 2019-03-25 DIAGNOSIS — Z5181 Encounter for therapeutic drug level monitoring: Secondary | ICD-10-CM | POA: Diagnosis not present

## 2019-04-01 DIAGNOSIS — Z5181 Encounter for therapeutic drug level monitoring: Secondary | ICD-10-CM | POA: Diagnosis not present

## 2019-08-14 DIAGNOSIS — Z419 Encounter for procedure for purposes other than remedying health state, unspecified: Secondary | ICD-10-CM | POA: Diagnosis not present

## 2019-09-03 NOTE — Telephone Encounter (Signed)
Pt has delivered

## 2019-09-14 DIAGNOSIS — Z419 Encounter for procedure for purposes other than remedying health state, unspecified: Secondary | ICD-10-CM | POA: Diagnosis not present

## 2019-10-15 DIAGNOSIS — Z419 Encounter for procedure for purposes other than remedying health state, unspecified: Secondary | ICD-10-CM | POA: Diagnosis not present

## 2019-11-14 DIAGNOSIS — Z419 Encounter for procedure for purposes other than remedying health state, unspecified: Secondary | ICD-10-CM | POA: Diagnosis not present

## 2019-12-15 DIAGNOSIS — Z419 Encounter for procedure for purposes other than remedying health state, unspecified: Secondary | ICD-10-CM | POA: Diagnosis not present

## 2020-01-14 DIAGNOSIS — Z419 Encounter for procedure for purposes other than remedying health state, unspecified: Secondary | ICD-10-CM | POA: Diagnosis not present

## 2020-02-14 DIAGNOSIS — Z419 Encounter for procedure for purposes other than remedying health state, unspecified: Secondary | ICD-10-CM | POA: Diagnosis not present

## 2020-02-23 ENCOUNTER — Other Ambulatory Visit: Payer: Self-pay

## 2020-02-23 ENCOUNTER — Encounter: Payer: Self-pay | Admitting: Obstetrics & Gynecology

## 2020-03-04 IMAGING — US US PELVIS COMPLETE TRANSABD/TRANSVAG
1 series · 13 of 25 positions shown · non-contrast
Comparison: None

CLINICAL DATA: Dysfunctional vaginal bleeding.

EXAM:
TRANSABDOMINAL AND TRANSVAGINAL ULTRASOUND OF PELVIS
TECHNIQUE: Both transabdominal and transvaginal ultrasound examinations of the
pelvis were performed. Transabdominal technique was performed for
global imaging of the pelvis including uterus, ovaries, adnexal
regions, and pelvic cul-de-sac. It was necessary to proceed with
endovaginal exam following the transabdominal exam to visualize the
ovaries, adnexa, uterus and endometrium.

[Series 1: us pelvis complete transabd/transvag · 13 of 81 slices shown]
[im 1/81]
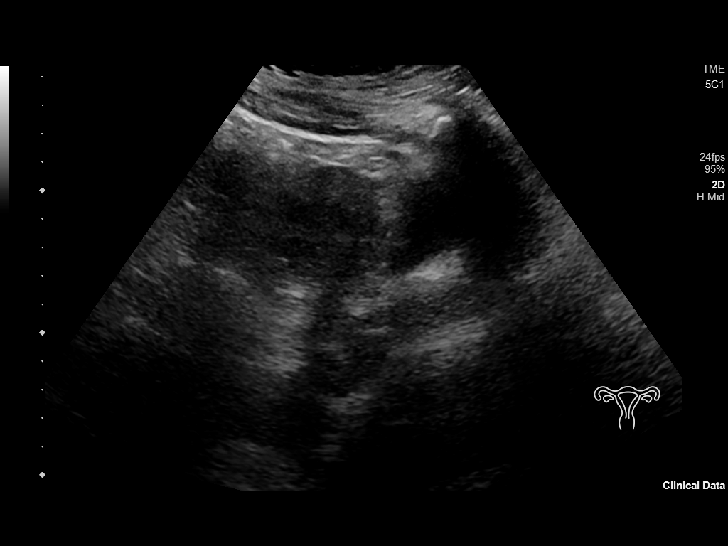
[im 7/81]
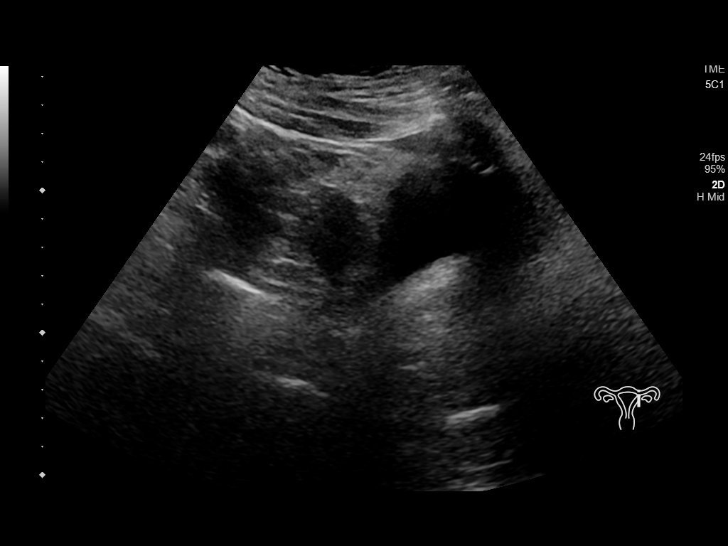
[im 14/81]
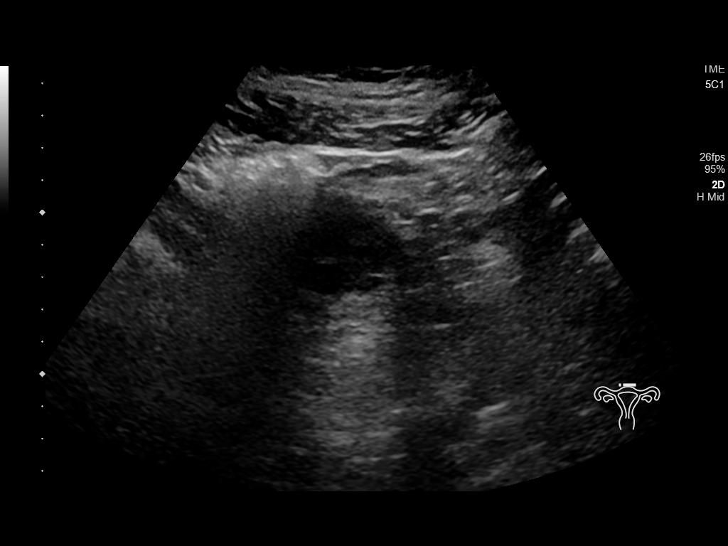
[im 21/81]
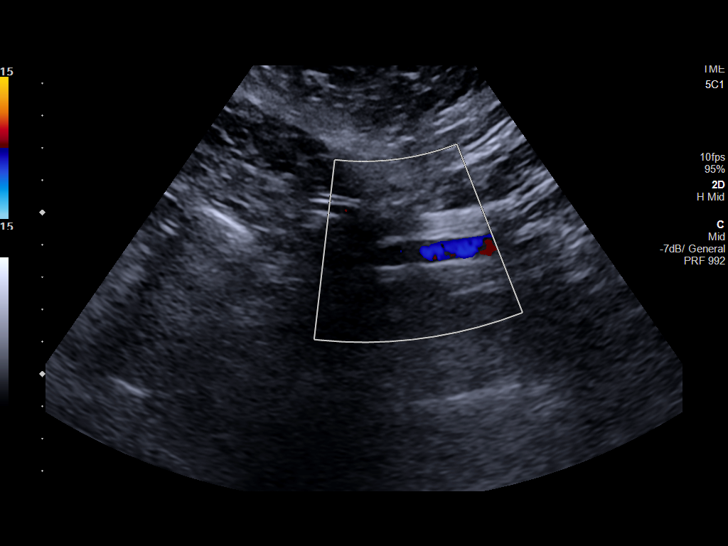
[im 27/81]
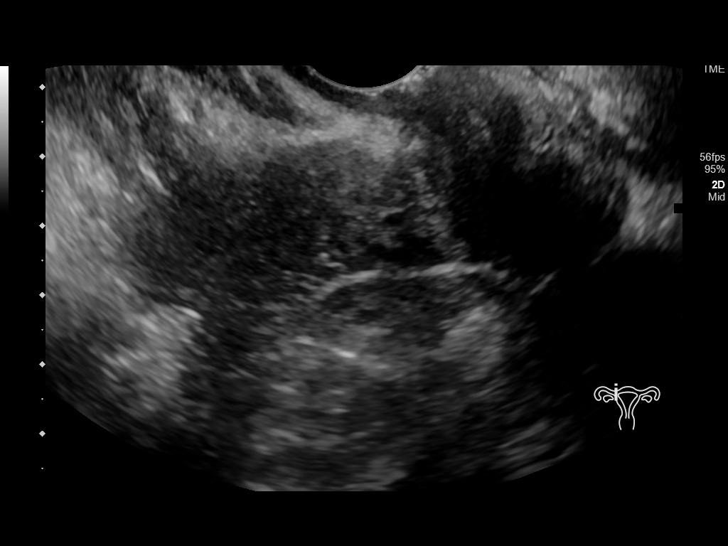
[im 34/81]
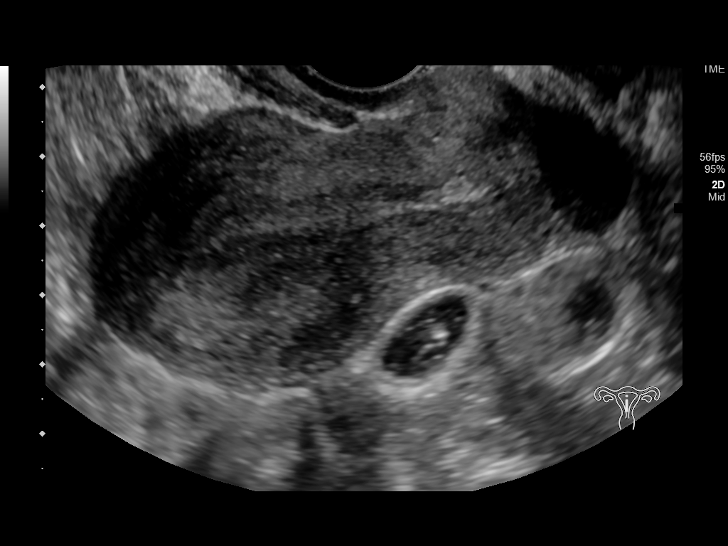
[im 41/81]
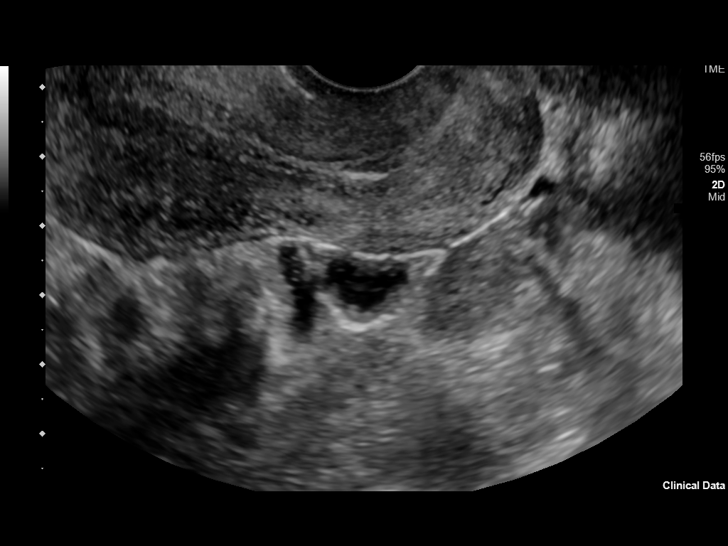
[im 47/81]
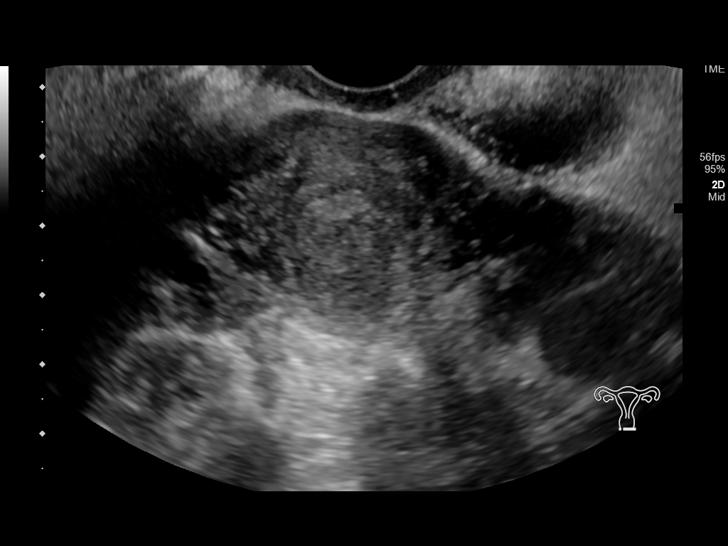
[im 54/81]
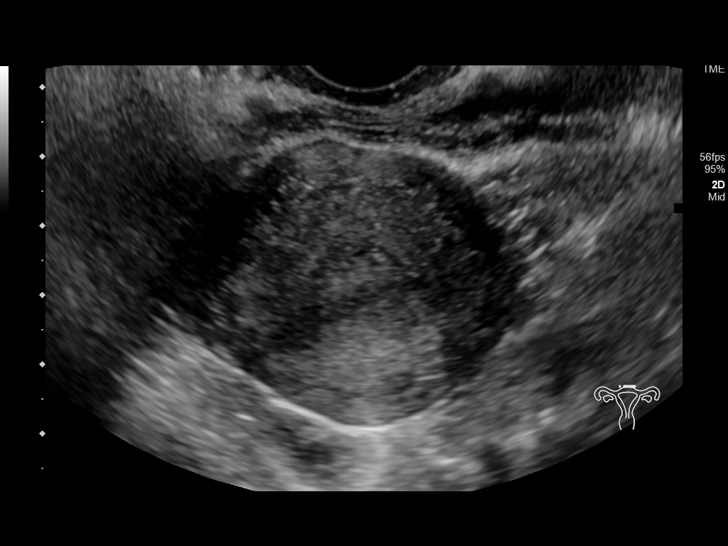
[im 61/81]
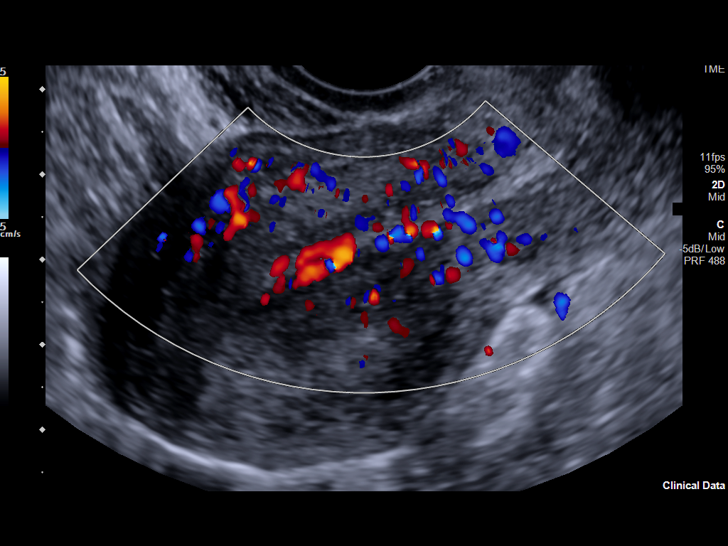
[im 67/81]
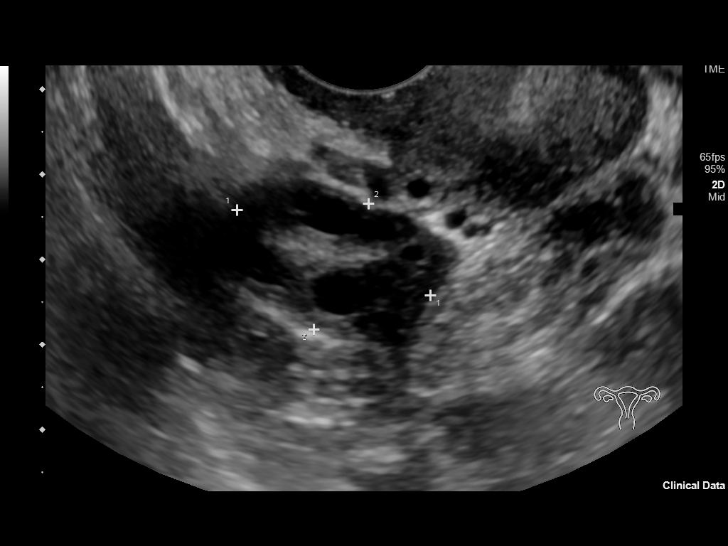
[im 74/81]
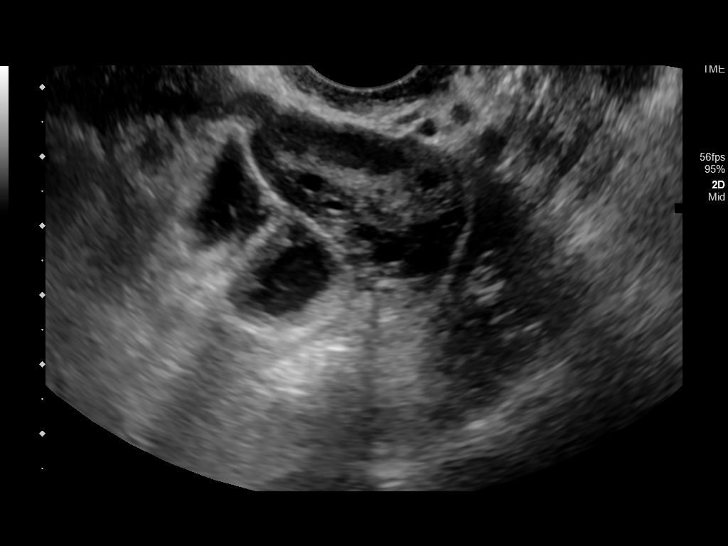
[im 81/81]
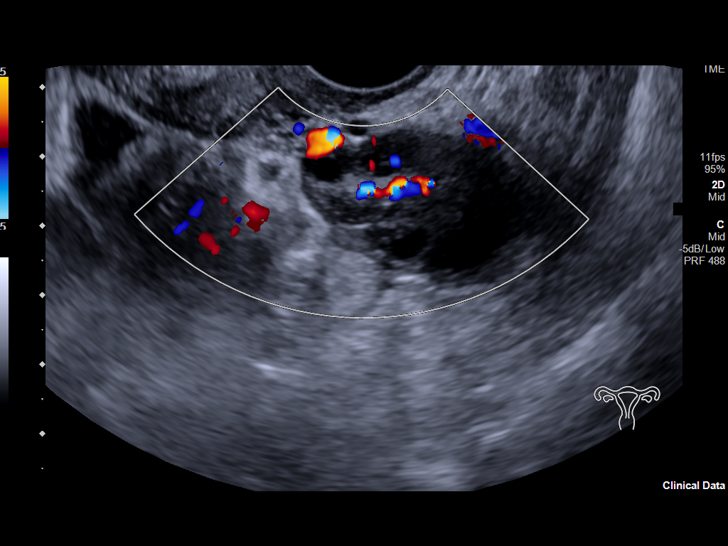

[13 of 25 positions shown; findings below may reference images not displayed]

FINDINGS: Uterus

Measurements: 8.0 x 4.1 x 4.5 cm = volume: 75 mL. No fibroids or
other mass visualized.

Endometrium

Thickness: 6 mm, normal.  No focal abnormality visualized.

Right ovary

Measurements: 2.5 x 1.6 x 2.0 cm = volume: 4.3 mL. Multiple
follicles (however less than 20) which are slightly peripherally
distributed. No cyst or mass. Normal blood flow.

Left ovary

Measurements: 3.0 x 1.8 x 2.6 cm = volume: 7.5 mL. Multiple
follicles (however less than 20) which are peripherally distributed.
No cyst or mass. Normal flow..

Other findings

No abnormal free fluid.
IMPRESSION: 1. Unremarkable sonographic appearance of the uterus and
endometrium.
2. Multiple follicles in both ovaries which are peripherally
distributed, however ovaries do not meet sonographic criteria for
polycystic ovarian syndrome.

## 2020-03-16 DIAGNOSIS — Z419 Encounter for procedure for purposes other than remedying health state, unspecified: Secondary | ICD-10-CM | POA: Diagnosis not present

## 2020-04-13 DIAGNOSIS — Z419 Encounter for procedure for purposes other than remedying health state, unspecified: Secondary | ICD-10-CM | POA: Diagnosis not present

## 2020-05-14 DIAGNOSIS — Z419 Encounter for procedure for purposes other than remedying health state, unspecified: Secondary | ICD-10-CM | POA: Diagnosis not present

## 2020-05-20 DIAGNOSIS — Z5181 Encounter for therapeutic drug level monitoring: Secondary | ICD-10-CM | POA: Diagnosis not present

## 2020-06-03 DIAGNOSIS — Z5181 Encounter for therapeutic drug level monitoring: Secondary | ICD-10-CM | POA: Diagnosis not present

## 2020-06-13 DIAGNOSIS — Z419 Encounter for procedure for purposes other than remedying health state, unspecified: Secondary | ICD-10-CM | POA: Diagnosis not present

## 2020-06-17 DIAGNOSIS — Z5181 Encounter for therapeutic drug level monitoring: Secondary | ICD-10-CM | POA: Diagnosis not present

## 2020-06-30 DIAGNOSIS — Z5181 Encounter for therapeutic drug level monitoring: Secondary | ICD-10-CM | POA: Diagnosis not present

## 2020-07-14 DIAGNOSIS — Z419 Encounter for procedure for purposes other than remedying health state, unspecified: Secondary | ICD-10-CM | POA: Diagnosis not present

## 2020-07-20 DIAGNOSIS — Z5181 Encounter for therapeutic drug level monitoring: Secondary | ICD-10-CM | POA: Diagnosis not present

## 2020-08-13 DIAGNOSIS — Z419 Encounter for procedure for purposes other than remedying health state, unspecified: Secondary | ICD-10-CM | POA: Diagnosis not present

## 2020-09-13 DIAGNOSIS — Z419 Encounter for procedure for purposes other than remedying health state, unspecified: Secondary | ICD-10-CM | POA: Diagnosis not present

## 2020-10-14 DIAGNOSIS — Z419 Encounter for procedure for purposes other than remedying health state, unspecified: Secondary | ICD-10-CM | POA: Diagnosis not present

## 2020-11-13 DIAGNOSIS — Z419 Encounter for procedure for purposes other than remedying health state, unspecified: Secondary | ICD-10-CM | POA: Diagnosis not present

## 2020-12-14 DIAGNOSIS — Z419 Encounter for procedure for purposes other than remedying health state, unspecified: Secondary | ICD-10-CM | POA: Diagnosis not present

## 2021-01-13 DIAGNOSIS — Z419 Encounter for procedure for purposes other than remedying health state, unspecified: Secondary | ICD-10-CM | POA: Diagnosis not present

## 2021-02-13 DIAGNOSIS — Z419 Encounter for procedure for purposes other than remedying health state, unspecified: Secondary | ICD-10-CM | POA: Diagnosis not present

## 2021-03-16 DIAGNOSIS — Z419 Encounter for procedure for purposes other than remedying health state, unspecified: Secondary | ICD-10-CM | POA: Diagnosis not present

## 2021-04-13 DIAGNOSIS — Z419 Encounter for procedure for purposes other than remedying health state, unspecified: Secondary | ICD-10-CM | POA: Diagnosis not present

## 2021-05-14 DIAGNOSIS — Z419 Encounter for procedure for purposes other than remedying health state, unspecified: Secondary | ICD-10-CM | POA: Diagnosis not present

## 2021-06-13 DIAGNOSIS — Z419 Encounter for procedure for purposes other than remedying health state, unspecified: Secondary | ICD-10-CM | POA: Diagnosis not present

## 2021-07-14 DIAGNOSIS — Z419 Encounter for procedure for purposes other than remedying health state, unspecified: Secondary | ICD-10-CM | POA: Diagnosis not present

## 2021-08-13 DIAGNOSIS — Z419 Encounter for procedure for purposes other than remedying health state, unspecified: Secondary | ICD-10-CM | POA: Diagnosis not present

## 2021-09-13 DIAGNOSIS — Z419 Encounter for procedure for purposes other than remedying health state, unspecified: Secondary | ICD-10-CM | POA: Diagnosis not present

## 2021-10-14 DIAGNOSIS — Z419 Encounter for procedure for purposes other than remedying health state, unspecified: Secondary | ICD-10-CM | POA: Diagnosis not present

## 2021-11-13 DIAGNOSIS — Z419 Encounter for procedure for purposes other than remedying health state, unspecified: Secondary | ICD-10-CM | POA: Diagnosis not present

## 2021-11-24 ENCOUNTER — Ambulatory Visit: Payer: Medicaid Other | Admitting: Obstetrics

## 2021-12-09 ENCOUNTER — Ambulatory Visit: Payer: Medicaid Other | Admitting: Obstetrics

## 2021-12-14 DIAGNOSIS — Z419 Encounter for procedure for purposes other than remedying health state, unspecified: Secondary | ICD-10-CM | POA: Diagnosis not present

## 2022-01-13 DIAGNOSIS — H5213 Myopia, bilateral: Secondary | ICD-10-CM | POA: Diagnosis not present

## 2022-01-13 DIAGNOSIS — Z419 Encounter for procedure for purposes other than remedying health state, unspecified: Secondary | ICD-10-CM | POA: Diagnosis not present

## 2022-02-13 DIAGNOSIS — Z419 Encounter for procedure for purposes other than remedying health state, unspecified: Secondary | ICD-10-CM | POA: Diagnosis not present

## 2022-03-07 ENCOUNTER — Encounter: Payer: Self-pay | Admitting: Obstetrics

## 2022-03-07 ENCOUNTER — Ambulatory Visit (INDEPENDENT_AMBULATORY_CARE_PROVIDER_SITE_OTHER): Payer: Medicaid Other | Admitting: Obstetrics

## 2022-03-07 VITALS — BP 118/76 | HR 88 | Ht 71.0 in | Wt 192.0 lb

## 2022-03-07 DIAGNOSIS — Z3046 Encounter for surveillance of implantable subdermal contraceptive: Secondary | ICD-10-CM

## 2022-03-07 NOTE — Progress Notes (Signed)
NEXPLANON REMOVAL   SUBJECTIVE Donna David is a 32 y.o. [id number] who presents today for removal of her Nexplanon. Her Nexplanon was placed 3 years ago. She would like to use more natural methods for contraception.  OBJECTIVE Vitals:   03/07/22 1544  BP: 118/76  Pulse: 88    UPT:  Procedure Note Consent was obtained before beginning this procedure. The Nexplanon was palpated and the surrounding skin was prepped with iodine in sterile fashion. Adequate anesthesia was achieved with subdermal injection of 1% lidocaine. A skin incision was made over the distal aspect of the device. The capsule was lysed sharply and the device was removed with a hemostat. Hemostasis was achieved. The incision site was closed with with a steristrip and a pressure dressing was applied. Donna David tolerated the procedure well.  Standard post-procedure care and precautions were reviewed. Donna David verbalized understanding. Reviewed fertility awareness methods and condom use.  Lloyd Huger, CNM

## 2022-03-09 ENCOUNTER — Encounter: Payer: Self-pay | Admitting: Obstetrics

## 2022-03-16 DIAGNOSIS — Z419 Encounter for procedure for purposes other than remedying health state, unspecified: Secondary | ICD-10-CM | POA: Diagnosis not present

## 2022-03-27 ENCOUNTER — Other Ambulatory Visit: Payer: Self-pay

## 2022-03-27 ENCOUNTER — Emergency Department
Admission: EM | Admit: 2022-03-27 | Discharge: 2022-03-27 | Disposition: A | Discharge status: Home / Self Care | Payer: Medicaid Other | Attending: Emergency Medicine | Admitting: Emergency Medicine

## 2022-03-27 DIAGNOSIS — H6692 Otitis media, unspecified, left ear: Secondary | ICD-10-CM | POA: Insufficient documentation

## 2022-03-27 DIAGNOSIS — H6690 Otitis media, unspecified, unspecified ear: Secondary | ICD-10-CM

## 2022-03-27 DIAGNOSIS — H9202 Otalgia, left ear: Secondary | ICD-10-CM | POA: Diagnosis present

## 2022-03-27 MED ORDER — AMOXICILLIN-POT CLAVULANATE 875-125 MG PO TABS: 1.0000 | ORAL_TABLET | Freq: Two times a day (BID) | ORAL | 0 refills | Status: AC

## 2022-03-27 MED ORDER — AMOXICILLIN-POT CLAVULANATE 875-125 MG PO TABS: 1.0000 | ORAL_TABLET | Freq: Two times a day (BID) | ORAL | 0 refills | Status: DC

## 2022-03-27 NOTE — Discharge Instructions (Signed)
Please take the antibiotic as prescribed for your ear infection.  You may take the Tylenol and ibuprofen per package instructions to help with your symptoms.  Please return for any new, worsening, or change in symptoms or other concerns

## 2022-03-27 NOTE — ED Triage Notes (Signed)
Pt to ED for left ear pain that started a week ago. No relief with tylenol. Denies fevers.

## 2022-03-27 NOTE — ED Provider Notes (Signed)
[facility] Provider Note    Event Date/Time   First MD Initiated Contact with Patient 03/27/22 [phone]     (approximate)   History   Otalgia   HPI  Donna David is a 32 y.o. female who presents today for evaluation of left-sided ear pain for the past 1 week.  Mom reports that she has been taking Tylenol but it has not been causing significant improvement.  She has not had a fever or chills.  She reports that she recently had a cold with significant nasal congestion and cough, which is improving, but the ear pain has since begun.  No change in her hearing.  No discharge from her ear.  Patient Active Problem List   Diagnosis Date Noted   Encounter for induction of labor 12/22/2018   Variable fetal heart rate decelerations, delivered 12/22/2018   Liveborn infant by vaginal delivery 12/22/2018   Postpartum care following vaginal delivery 12/22/2018   Indication for care in labor and delivery, antepartum 12/19/2018   IUGR (intrauterine growth restriction) affecting care of mother 12/19/2018   Diet controlled gestational diabetes mellitus (GDM) in third trimester 12/18/2018   Chlamydia infection complicating pregnancy [id number]   Abnormal oral glucose tolerance test 09/10/2018   Supervision of high risk pregnancy, antepartum 07/16/2018   Obesity in pregnancy 07/16/2018   Obesity (BMI 35.0-39.9 without comorbidity) 07/16/2018   History of third degree perineal laceration 07/16/2018          Physical Exam   Triage Vital Signs: ED Triage Vitals  Enc Vitals Group     BP 03/27/22 0902 112/68     Pulse Rate 03/27/22 0901 87     Resp 03/27/22 0900 18     Temp 03/27/22 0901 98.4 F (36.9 C)     Temp src --      SpO2 03/27/22 0901 97 %     Weight 03/27/22 0901 290 lb (131.5 kg)     Height 03/27/22 0901 5' 11"$  (1.803 m)     Head Circumference --      Peak Flow --      Pain Score 03/27/22 0901 5     Pain Loc --      Pain Edu? --      Excl. in [place]? --      Most recent vital signs: Vitals:   03/27/22 0901 03/27/22 0902  BP:  112/68  Pulse: 87   Resp:    Temp: 98.4 F (36.9 C)   SpO2: 97%     Physical Exam Vitals and nursing note reviewed.  Constitutional:      General: Awake and alert. No acute distress.    Appearance: Normal appearance. The patient is obese.  HENT:     Head: Normocephalic and atraumatic.     Mouth: Mucous membranes are moist.  Left ear with bulging and erythematous tympanic membrane.  Canal clear.  No otorrhea.  No proptosis of pinna. Right ear normal Eyes:     General: PERRL. Normal EOMs        Right eye: No discharge.        Left eye: No discharge.     Conjunctiva/sclera: Conjunctivae normal.  Cardiovascular:     Rate and Rhythm: Normal rate and regular rhythm.     Pulses: Normal pulses.  Pulmonary:     Effort: Pulmonary effort is normal. No respiratory distress.     Breath sounds: Normal breath sounds.  Abdominal:     Abdomen is soft. There is  no abdominal tenderness.  Musculoskeletal:        General: No swelling. Normal range of motion.     Cervical back: Normal range of motion and neck supple.  Skin:    General: Skin is warm and dry.     Capillary Refill: Capillary refill takes less than 2 seconds.     Findings: No rash.  Neurological:     Mental Status: The patient is awake and alert.      ED Results / Procedures / Treatments   Labs (all labs ordered are listed, but only abnormal results are displayed) Labs Reviewed - No data to display   EKG     RADIOLOGY     PROCEDURES:  Critical Care performed:   Procedures   MEDICATIONS ORDERED IN ED: Medications - No data to display   IMPRESSION / MDM / [place] / ED COURSE  I reviewed the triage vital signs and the nursing notes.   Differential diagnosis includes, but is not limited to, otitis externa, otitis media, eustachian tube dysfunction.  Patient is awake and alert, hemodynamically stable and afebrile.   She has a bulging and erythematous tympanic membrane consistent with otitis media.  Canals clear, no otorrhea.  No pain with manipulation of pinna.  No mastoid tenderness erythema or proptosis of pinna suggest mastoiditis.  She started on antibiotics.  Discussed strict return precautions and the importance of close outpatient follow-up.  Also discussed symptomatic management.  Offered COVID/flu/RSV test but she declined.  Discussed return precautions.  Patient was discharged in stable condition.   Patient's presentation is most consistent with acute complicated illness / injury requiring diagnostic workup.      FINAL CLINICAL IMPRESSION(S) / ED DIAGNOSES   Final diagnoses:  Acute otitis media, unspecified otitis media type     Rx / DC Orders   ED Discharge Orders          Ordered    amoxicillin-clavulanate (AUGMENTIN) 875-125 MG tablet  2 times daily,   Status:  Discontinued        03/27/22 0917    amoxicillin-clavulanate (AUGMENTIN) 875-125 MG tablet  2 times daily        03/27/22 [id number]             Note:  This document was prepared using Dragon voice recognition software and may include unintentional dictation errors.   Emeline Gins 03/27/22 [id number]    Lavonia Drafts, MD 03/27/22 1104

## 2022-04-14 DIAGNOSIS — Z419 Encounter for procedure for purposes other than remedying health state, unspecified: Secondary | ICD-10-CM | POA: Diagnosis not present

## 2022-05-15 DIAGNOSIS — Z419 Encounter for procedure for purposes other than remedying health state, unspecified: Secondary | ICD-10-CM | POA: Diagnosis not present

## 2022-06-14 DIAGNOSIS — Z419 Encounter for procedure for purposes other than remedying health state, unspecified: Secondary | ICD-10-CM | POA: Diagnosis not present

## 2022-07-15 DIAGNOSIS — Z419 Encounter for procedure for purposes other than remedying health state, unspecified: Secondary | ICD-10-CM | POA: Diagnosis not present

## 2022-08-14 DIAGNOSIS — Z419 Encounter for procedure for purposes other than remedying health state, unspecified: Secondary | ICD-10-CM | POA: Diagnosis not present

## 2022-09-14 DIAGNOSIS — Z419 Encounter for procedure for purposes other than remedying health state, unspecified: Secondary | ICD-10-CM | POA: Diagnosis not present

## 2022-10-15 DIAGNOSIS — Z419 Encounter for procedure for purposes other than remedying health state, unspecified: Secondary | ICD-10-CM | POA: Diagnosis not present

## 2022-11-14 DIAGNOSIS — Z419 Encounter for procedure for purposes other than remedying health state, unspecified: Secondary | ICD-10-CM | POA: Diagnosis not present

## 2022-12-15 DIAGNOSIS — Z419 Encounter for procedure for purposes other than remedying health state, unspecified: Secondary | ICD-10-CM | POA: Diagnosis not present

## 2023-01-14 DIAGNOSIS — Z419 Encounter for procedure for purposes other than remedying health state, unspecified: Secondary | ICD-10-CM | POA: Diagnosis not present

## 2023-02-14 DIAGNOSIS — Z419 Encounter for procedure for purposes other than remedying health state, unspecified: Secondary | ICD-10-CM | POA: Diagnosis not present

## 2023-03-17 DIAGNOSIS — Z419 Encounter for procedure for purposes other than remedying health state, unspecified: Secondary | ICD-10-CM | POA: Diagnosis not present

## 2023-04-14 DIAGNOSIS — Z419 Encounter for procedure for purposes other than remedying health state, unspecified: Secondary | ICD-10-CM | POA: Diagnosis not present

## 2023-04-25 DIAGNOSIS — Z419 Encounter for procedure for purposes other than remedying health state, unspecified: Secondary | ICD-10-CM | POA: Diagnosis not present

## 2023-04-30 ENCOUNTER — Other Ambulatory Visit: Payer: Self-pay

## 2023-04-30 ENCOUNTER — Emergency Department

## 2023-04-30 ENCOUNTER — Emergency Department
Admission: EM | Admit: 2023-04-30 | Discharge: 2023-04-30 | Discharge status: Against Medical Advice | Attending: Emergency Medicine | Admitting: Emergency Medicine

## 2023-04-30 DIAGNOSIS — R079 Chest pain, unspecified: Secondary | ICD-10-CM | POA: Diagnosis not present

## 2023-04-30 DIAGNOSIS — Z5321 Procedure and treatment not carried out due to patient leaving prior to being seen by health care provider: Secondary | ICD-10-CM | POA: Insufficient documentation

## 2023-04-30 DIAGNOSIS — R42 Dizziness and giddiness: Secondary | ICD-10-CM | POA: Diagnosis not present

## 2023-04-30 LAB — BASIC METABOLIC PANEL
Anion gap: 7 (ref 5–15)
BUN: 14 mg/dL (ref 6–20)
CO2: 24 mmol/L (ref 22–32)
Calcium: 9.7 mg/dL (ref 8.9–10.3)
Chloride: 108 mmol/L (ref 98–111)
Creatinine, Ser: 0.82 mg/dL (ref 0.44–1.00)
GFR, Estimated: >60 mL/min (ref >60)
Glucose, Bld: 109 mg/dL — ABNORMAL HIGH (ref 70–99)
Potassium: 3.8 mmol/L (ref 3.5–5.1)
Sodium: 139 mmol/L (ref 135–145)

## 2023-04-30 LAB — TROPONIN I (HIGH SENSITIVITY): Troponin I (High Sensitivity): <2 ng/L (ref <18)

## 2023-04-30 LAB — CBC
HCT: 42.7 % (ref 36.0–46.0)
Hemoglobin: 14.1 g/dL (ref 12.0–15.0)
MCH: 28.3 pg (ref 26.0–34.0)
MCHC: 33 g/dL (ref 30.0–36.0)
MCV: 85.6 fL (ref 80.0–100.0)
Platelets: 376 10*3/uL (ref 150–400)
RBC: 4.99 MIL/uL (ref 3.87–5.11)
RDW: 13.6 % (ref 11.5–15.5)
WBC: 11.9 10*3/uL — ABNORMAL HIGH (ref 4.0–10.5)
nRBC: 0 % (ref 0.0–0.2)

## 2023-04-30 NOTE — ED Triage Notes (Addendum)
 Pt reports intermittent chest pain x 2 weeks, pt states today pain has been worse and she became dizzy. Pt denies cough congestion or fever. Pt reports she thinks she is having panic attacks.

## 2023-05-01 ENCOUNTER — Encounter: Payer: Self-pay | Admitting: Emergency Medicine

## 2023-05-01 ENCOUNTER — Telehealth

## 2023-05-01 ENCOUNTER — Emergency Department

## 2023-05-01 ENCOUNTER — Emergency Department
Admission: EM | Admit: 2023-05-01 | Discharge: 2023-05-01 | Disposition: A | Discharge status: Home / Self Care | Attending: Student in an Organized Health Care Education/Training Program | Admitting: Student in an Organized Health Care Education/Training Program

## 2023-05-01 DIAGNOSIS — R079 Chest pain, unspecified: Secondary | ICD-10-CM | POA: Insufficient documentation

## 2023-05-01 DIAGNOSIS — R0789 Other chest pain: Secondary | ICD-10-CM | POA: Diagnosis not present

## 2023-05-01 LAB — BASIC METABOLIC PANEL
Anion gap: 6 (ref 5–15)
BUN: 12 mg/dL (ref 6–20)
CO2: 20 mmol/L — ABNORMAL LOW (ref 22–32)
Calcium: 9.3 mg/dL (ref 8.9–10.3)
Chloride: 111 mmol/L (ref 98–111)
Creatinine, Ser: 0.73 mg/dL (ref 0.44–1.00)
GFR, Estimated: >60 mL/min (ref >60)
Glucose, Bld: 93 mg/dL (ref 70–99)
Potassium: 4 mmol/L (ref 3.5–5.1)
Sodium: 137 mmol/L (ref 135–145)

## 2023-05-01 LAB — CBC
HCT: 43.3 % (ref 36.0–46.0)
Hemoglobin: 14.2 g/dL (ref 12.0–15.0)
MCH: 28 pg (ref 26.0–34.0)
MCHC: 32.8 g/dL (ref 30.0–36.0)
MCV: 85.4 fL (ref 80.0–100.0)
Platelets: 372 10*3/uL (ref 150–400)
RBC: 5.07 MIL/uL (ref 3.87–5.11)
RDW: 13.6 % (ref 11.5–15.5)
WBC: 11.2 10*3/uL — ABNORMAL HIGH (ref 4.0–10.5)
nRBC: 0 % (ref 0.0–0.2)

## 2023-05-01 LAB — TROPONIN I (HIGH SENSITIVITY): Troponin I (High Sensitivity): <2 ng/L (ref <18)

## 2023-05-01 MED ORDER — HYDROXYZINE HCL 10 MG PO TABS: 10.0000 mg | ORAL_TABLET | Freq: Three times a day (TID) | ORAL | 0 refills | Status: DC | PRN

## 2023-05-01 NOTE — ED Triage Notes (Signed)
 Patient to ED via POV for CP, headache,and dizziness. Seen last night for same but LWBS. States under left breast. Ongoing for a couple weeks. Denies cardiac hx

## 2023-05-01 NOTE — ED Provider Notes (Signed)
 [facility] Provider Note    Event Date/Time   First MD Initiated Contact with Patient 05/01/23 1429     (approximate)   History   Chest Pain   HPI  Donna David is a 33 y.o. female who presents to the ER for evaluation of chest discomfort feeling lightheaded like she is having panic attacks.  She does admit to significant stressors over the past couple months.  Has also had some headaches.  No blurred vision.  No numbness or tingling.  No thunderclap headaches are the worst headache of her life.  No fevers or chills.  Presented to the ER last night where she felt like she was about to pass out while driving was having some chest pain thought she was having a panic attack.  She left without being seen.     Physical Exam   Triage Vital Signs: ED Triage Vitals  Encounter Vitals Group     BP 05/01/23 1143 117/88     Systolic BP Percentile --      Diastolic BP Percentile --      Pulse Rate 05/01/23 1143 80     Resp 05/01/23 1143 18     Temp 05/01/23 1143 98.2 F (36.8 C)     Temp Source 05/01/23 1143 Oral     SpO2 05/01/23 1143 100 %     Weight 05/01/23 1141 250 lb (113.4 kg)     Height 05/01/23 1141 5\' 11"  (1.803 m)     Head Circumference --      Peak Flow --      Pain Score 05/01/23 1141 3     Pain Loc --      Pain Education --      Exclude from Growth Chart --     Most recent vital signs: Vitals:   05/01/23 1143 05/01/23 1430  BP: 117/88 106/63  Pulse: 80 68  Resp: 18 17  Temp: 98.2 F (36.8 C)   SpO2: 100% 97%     Constitutional: Alert  Eyes: Conjunctivae are normal.  Head: Atraumatic. Nose: No congestion/rhinnorhea. Mouth/Throat: Mucous membranes are moist.   Neck: Painless ROM.  Cardiovascular:   Good peripheral circulation. Respiratory: Normal respiratory effort.  No retractions.  Gastrointestinal: Soft and nontender.  Musculoskeletal:  no deformity Neurologic:  MAE spontaneously. No gross focal neurologic deficits are  appreciated.  Skin:  Skin is warm, dry and intact. No rash noted. Psychiatric: Mood and affect are normal. Speech and behavior are normal.    ED Results / Procedures / Treatments   Labs (all labs ordered are listed, but only abnormal results are displayed) Labs Reviewed  BASIC METABOLIC PANEL - Abnormal; Notable for the following components:      Result Value   CO2 20 (*)    All other components within normal limits  CBC - Abnormal; Notable for the following components:   WBC 11.2 (*)    All other components within normal limits  TROPONIN I (HIGH SENSITIVITY)     EKG  ED ECG REPORT I, Willy Eddy, the attending physician, personally viewed and interpreted this ECG.   Date: 05/01/2023  EKG Time: 11:45  Rate: 80  Rhythm: sinus  Axis:  normal  Intervals: norma  ST&T Change: no stemi, no depressions    RADIOLOGY Please see ED Course for my review and interpretation.  I personally reviewed all radiographic images ordered to evaluate for the above acute complaints and reviewed radiology reports and findings.  These findings  were personally discussed with the patient.  Please see medical record for radiology report.    PROCEDURES:  Procedures   MEDICATIONS ORDERED IN ED: Medications - No data to display   IMPRESSION / MDM / ASSESSMENT [employer] PLAN / ED COURSE  I reviewed the triage vital signs and the nursing notes.                              Differential diagnosis includes, but is not limited to, dysrhythmia, ACS, costochondritis, bronchitis, PE, pneumothorax, anxiety  Patient presenting to the ER for evaluation of symptoms as described above.  Based on symptoms, risk factors and considered above differential, this presenting complaint could reflect a potentially life-threatening illness therefore the patient will be placed on continuous pulse oximetry and telemetry for monitoring.  Laboratory evaluation will be sent to evaluate for the above complaints.   Patient clinically well-appearing in no acute distress.  She is lowers by Wells criteria she is PERC negative.  EKG nonischemic.  Not consistent with pericarditis.  Not consistent with pneumothorax on my review and interpretation the chest x-ray.  Is not consistent with dissection.  Regarding her headache she does not have any focal neurodeficits is not consistent with SAH, SDH, IPH or mass.  Patient undergoing quite a bit of stressors recently I do suspect this is contributing to her symptoms.  We discussed conservative management and discussed the importance of outpatient follow-up.       FINAL CLINICAL IMPRESSION(S) / ED DIAGNOSES   Final diagnoses:  Chest pain, unspecified type     Rx / DC Orders   ED Discharge Orders          Ordered    hydrOXYzine (ATARAX) 10 MG tablet  Every 8 hours PRN        05/01/23 1455             Note:  This document was prepared using Dragon voice recognition software and may include unintentional dictation errors.    Willy Eddy, MD 05/01/23 [phone]

## 2023-05-01 NOTE — Discharge Instructions (Signed)
 You can try Unisom (1/2 tab) 30 min before bedtime to help with sleep.  Please follow up with PCP.

## 2023-05-16 ENCOUNTER — Ambulatory Visit: Admitting: Family Medicine

## 2023-05-16 ENCOUNTER — Encounter: Payer: Self-pay | Admitting: Family Medicine

## 2023-05-16 VITALS — BP 120/77 | HR 93 | Ht 71.0 in | Wt 259.8 lb

## 2023-05-16 DIAGNOSIS — E6609 Other obesity due to excess calories: Secondary | ICD-10-CM

## 2023-05-16 DIAGNOSIS — E66812 Obesity, class 2: Secondary | ICD-10-CM

## 2023-05-16 DIAGNOSIS — F5101 Primary insomnia: Secondary | ICD-10-CM

## 2023-05-16 DIAGNOSIS — F411 Generalized anxiety disorder: Secondary | ICD-10-CM | POA: Diagnosis not present

## 2023-05-16 DIAGNOSIS — Z6836 Body mass index (BMI) 36.0-36.9, adult: Secondary | ICD-10-CM | POA: Diagnosis not present

## 2023-05-16 DIAGNOSIS — R000 Tachycardia, unspecified: Secondary | ICD-10-CM | POA: Diagnosis not present

## 2023-05-16 DIAGNOSIS — Z7689 Persons encountering health services in other specified circumstances: Secondary | ICD-10-CM

## 2023-05-16 MED ORDER — PROPRANOLOL HCL 20 MG PO TABS: 20.0000 mg | ORAL_TABLET | Freq: Three times a day (TID) | ORAL | 2 refills | Status: AC

## 2023-05-16 MED ORDER — HYDROXYZINE HCL 25 MG PO TABS: 25.0000 mg | ORAL_TABLET | Freq: Four times a day (QID) | ORAL | 2 refills | Status: AC | PRN

## 2023-05-16 NOTE — Progress Notes (Signed)
 New Patient Office Visit  Subjective   Patient ID: Donna David, female    DOB: May 20, 1990  Age: 33 y.o. MRN: [id number]  CC:  Chief Complaint  Patient presents with   Establish Care    Establish Care, chest pain, heart palpitation, headaches    HPI Donna David is a 33 year old female who presents to establish with [facility] at [facility].   CC: Patient here to establish care  Last PCP: none  Denies past medical history.   CHEST PAIN: She presents today and is complaining of recent chest pain. She did go to the ED on 3/18 for chest pain. Two EKGs performed in NSR 80-83.  Chest pain does not always happen with palpitations/tachycardia and dizziness. This chest pain is new for her and has increased her anxiety.  Onset: sometimes it feels like her heart is beating fast, notices she always has a headache with her  Had a mixed drink with alcohol, noticed  Does not have an issue with coffee Notices mostly occurring at night, she would feel this in the morning after her shift. She has switched to 1st shift.  Location: L side of chest  Need to lie on R side, notices pain with lying on L side  Duration: when it happens, she will lie down and go to sleep.  Quality: aching & L shoulder pain also  Radiation: denies L arm pain, jaw back, back pain, and neck pain    Better with: hydroxyzine, has taken it about 4x since prescription- does not help chest pain but helps with anxiety  Worse with: Celsius energy drink Denies changes in vision, shortness of breath, lower extremity edema, headaches, weakness, cough.  All of the this stuff stopped when she smoking marijuana. Has been smoking since her teenage years.  She reports this went from a few times a month to a few times per week.   Symptoms Fever/chills: no  Nausea/vomiting: no  Diaphoresis: no  Shortness of breath: no  Cough: no  Edema: no  Orthopnea: no  Dizziness: yes, during episode  Palpitations: yes, during  episode  Syncope: no  Indigestion: no   Red Flags Worse with exertion: no  Recent Immobility: no  Cancer history: no  Tearing/radiation to back: no   Does not check her HR or BP at home.  Reports difficulty staying asleep- usually asleep by 12am until 6am.   The ASCVD Risk score (Arnett DK, et al., 2019) failed to calculate for the following reasons:   The 2019 ASCVD risk score is only valid for ages 57 to 23      05/16/2023    1:17 PM  GAD 7 : Generalized Anxiety Score  Nervous, Anxious, on Edge 1  Control/stop worrying 1  Worry too much - different things 1  Trouble relaxing 1  Restless 0  Easily annoyed or irritable 1  Afraid - awful might happen 1  Total GAD 7 Score 6  Anxiety Difficulty Somewhat difficult       05/16/2023    1:16 PM 12/16/2018    1:21 PM 12/12/2018    1:14 PM  PHQ9 SCORE ONLY  PHQ-9 Total Score 7 0 0    Outpatient Encounter Medications as of 05/16/2023  Medication Sig   propranolol (INDERAL) 20 MG tablet Take 1 tablet (20 mg total) by mouth 3 (three) times daily.   hydrOXYzine (ATARAX) 25 MG tablet Take 1 tablet (25 mg total) by mouth every 6 (six) hours as needed for anxiety.   [  DISCONTINUED] hydrOXYzine (ATARAX) 10 MG tablet Take 1 tablet (10 mg total) by mouth every 8 (eight) hours as needed for anxiety.   [DISCONTINUED] Prenatal Vit-Fe Fumarate-FA (MULTIVITAMIN-PRENATAL) 27-0.8 MG TABS tablet Take 1 tablet by mouth daily at 12 noon.   No facility-administered encounter medications on file as of 05/16/2023.    Patient Active Problem List   Diagnosis Date Noted   Encounter for induction of labor 12/22/2018   Variable fetal heart rate decelerations, delivered 12/22/2018   Liveborn infant by vaginal delivery 12/22/2018   Postpartum care following vaginal delivery 12/22/2018   Indication for care in labor and delivery, antepartum 12/19/2018   IUGR (intrauterine growth restriction) affecting care of mother 12/19/2018   Diet controlled gestational  diabetes mellitus (GDM) in third trimester 12/18/2018   Chlamydia infection complicating pregnancy 09/10/2018   Abnormal oral glucose tolerance test 09/10/2018   Supervision of high risk pregnancy, antepartum 07/16/2018   Obesity in pregnancy 07/16/2018   Obesity (BMI 35.0-39.9 without comorbidity) 07/16/2018   History of third degree perineal laceration 07/16/2018   Past Medical History:  Diagnosis Date   Obesity (BMI 35.0-39.9 without comorbidity)    Third degree perineal laceration    with G1   Past Surgical History:  Procedure Laterality Date   Repair of third degree perineal laceration  12/2013   TONSILLECTOMY     Family History  Problem Relation Age of Onset   Hypertension Mother    Melanoma Maternal Grandmother    Heart attack Maternal Grandfather    Hypertension Maternal Grandfather    Heart disease Maternal Grandfather    Cancer Maternal Great-grandfather    Heart attack Paternal Grandfather    Hypertension Paternal Grandfather    Diabetes Paternal Grandfather    Hypertension Paternal Uncle    Autism Cousin    Stomach cancer Paternal Great-grandmother    Cancer Maternal Great-grandmother    Social History   Socioeconomic History   Marital status: Single    Spouse name: Art gallery manager   Number of children: 1   Years of education: Not on file   Highest education level: Not on file  Occupational History   Not on file  Tobacco Use   Smoking status: Every Day    Types: Cigarettes    Passive exposure: Current   Smokeless tobacco: Never  Vaping Use   Vaping status: Never Used  Substance and Sexual Activity   Alcohol use: Yes    Comment: occ   Drug use: Not Currently    Types: Marijuana   Sexual activity: Yes    Partners: Male  Other Topics Concern   Not on file  Social History Narrative   ** Merged History Encounter **       Social Drivers of Corporate investment banker Strain: Not on file  Food Insecurity: Not on file  Transportation Needs: Not on file   Physical Activity: Not on file  Stress: Not on file  Social Connections: Not on file  Intimate Partner Violence: Not on file   Outpatient Medications Prior to Visit  Medication Sig Dispense Refill   hydrOXYzine (ATARAX) 10 MG tablet Take 1 tablet (10 mg total) by mouth every 8 (eight) hours as needed for anxiety. 12 tablet 0   Prenatal Vit-Fe Fumarate-FA (MULTIVITAMIN-PRENATAL) 27-0.8 MG TABS tablet Take 1 tablet by mouth daily at 12 noon.     No facility-administered medications prior to visit.   No Known Allergies  ROS: see HPI    Objective  Today's Vitals  05/16/23 1309  BP: 120/77  Pulse: 93  SpO2: 98%  Weight: 259 lb 12.8 oz (117.8 kg)  Height: 5\' 11"  (1.803 m)  PainSc: 3   PainLoc: Head   GENERAL: Well-appearing, in NAD. Well nourished.  SKIN: Pink, warm and dry. No rash, lesion, ulceration, or ecchymoses.  Head: Normocephalic. NECK: Trachea midline. Full ROM w/o pain or tenderness. No lymphadenopathy.  EARS: Tympanic membranes are intact, translucent without bulging and without drainage. Appropriate landmarks visualized.  EYES: Conjunctiva clear without exudates. EOMI, PERRL, no drainage present.  NOSE: Septum midline w/o deformity. Nares patent, mucosa pink and non-inflamed w/o drainage. No sinus tenderness.  THROAT: Uvula midline. Oropharynx clear. Tonsils non-inflamed without exudate. Mucous membranes pink and moist.  RESPIRATORY: Chest wall symmetrical. Respirations even and non-labored. Breath sounds clear to auscultation bilaterally.  CARDIAC: S1, S2 present, regular rate and rhythm without murmur or gallops. Peripheral pulses 2+ bilaterally.  MSK: Muscle tone and strength appropriate for age. Joints w/o tenderness, redness, or swelling.  EXTREMITIES: Without clubbing, cyanosis, or edema.  NEUROLOGIC: No motor or sensory deficits. Steady, even gait. C2-C12 intact.  PSYCH/MENTAL STATUS: Alert, oriented x 3. Cooperative, appropriate mood and affect.     Assessment & Plan:   1. Encounter to establish care (Primary) Patient is a 89- year-old female who presents today to establish care with primary care at [facility]. Reviewed the past medical history, family history, social history, surgical history, medications and allergies today- updates made as indicated. Patient has concerns today about chest pain.    2. Tachycardia Vital signs with acceptable blood pressure and regular rate and rhythm. HR is on the higher end of normal. Discussed use of propranolol for heart palpitations she has been experiencing at home. Patient in no acute distress and is well-appearing. Denies chest pain, shortness of breath, lower extremity edema, vision changes, headaches. Cardiovascular exam with heart regular rate and rhythm. Normal heart sounds, no murmurs present. No lower extremity edema present. Lungs clear to auscultation bilaterally. Review of previous EKG from 3/19 shows NSR at 80, without any acute ST abnormalities or additional contractions. No electrolyte abnormalities when reviewing labs from ED. Discussed this could be related to anxiety. Will check thyroid function. Rx sent for propranolol to use PRN for relief of tachycardia and palpitations. Will follow-up in 4 weeks.  - TSH Rfx on Abnormal to Free T4 - propranolol (INDERAL) 20 MG tablet; Take 1 tablet (20 mg total) by mouth 3 (three) times daily.  Dispense: 30 tablet; Refill: 2 - hydrOXYzine (ATARAX) 25 MG tablet; Take 1 tablet (25 mg total) by mouth every 6 (six) hours as needed for anxiety.  Dispense: 30 tablet; Refill: 2  3. Class 2 obesity due to excess calories without serious comorbidity with body mass index (BMI) of 36.0 to 36.9 in adult Discussed lifestyle modifications- including healthy diet and daily exercise.   4. Primary insomnia Discussed importance of sleep routine prior to bedtime- including soothing music, relaxation, shower, and avoiding screens. Will trial hydroxyzine PRN for anxiety and  insomnia. Will follow-up in 4 weeks to determine if sleep has improved.  - hydrOXYzine (ATARAX) 25 MG tablet; Take 1 tablet (25 mg total) by mouth every 6 (six) hours as needed for anxiety.  Dispense: 30 tablet; Refill: 2  5. GAD (generalized anxiety disorder) GAD7 completed with score of 6. Denies history of anxiety and reports that she does not feel an increase in anxiety. Discussed first-line pharmacotherapy for anxiety. Patient would like to treat the underlying cause. Provided  patient with reassurance that these symptoms are consistent with anxiety. Will trial propranolol TID PRN and hydroxyzine PRN for anxiety.  - propranolol (INDERAL) 20 MG tablet; Take 1 tablet (20 mg total) by mouth 3 (three) times daily.  Dispense: 30 tablet; Refill: 2 - hydrOXYzine (ATARAX) 25 MG tablet; Take 1 tablet (25 mg total) by mouth every 6 (six) hours as needed for anxiety.  Dispense: 30 tablet; Refill: 2    Return in about 4 weeks (around 06/13/2023) for Mood f/u & Physical with fasting labs.   Wilhelmena Hanson, FNP

## 2023-05-16 NOTE — Patient Instructions (Signed)
 MyChart:  For all urgent or time sensitive needs we ask that you please call the office to avoid delays. Our number is [phone]. MyChart is not constantly monitored and due to the large volume of messages a day, replies may take up to 72 business hours.   MyChart Policy: MyChart allows for you to see your visit notes, after visit summary, provider recommendations, lab and tests results, make an appointment, request refills, and contact your provider or the office for non-urgent questions or concerns. Providers are seeing patients during normal business hours and do not have built in time to review MyChart messages.  We ask that you allow a minimum of 3 business days for responses to [employer]. For this reason, please do not send urgent requests through MyChart. Please call the office at [phone]. New and ongoing conditions may require a visit. We have virtual and in person visit available for your convenience.  Complex MyChart concerns may require a visit. Your provider may request you schedule a virtual or in person visit to ensure we are providing the best care possible. MyChart messages sent after 11:00 AM on Friday will not be received by the provider until Monday morning.    Lab and Test Results: You will receive your lab and test results on MyChart as soon as they are completed and results have been sent by the lab or testing facility. Due to this service, you will receive your results BEFORE your provider.  I review lab and tests results each morning prior to seeing patients. Some results require collaboration with other providers to ensure you are receiving the most appropriate care. For this reason, we ask that you please allow a minimum of 3-5 business days from the time the ALL results have been received for your provider to receive and review lab and test results and contact you about these.  Most lab and test result comments from the provider will be sent through MyChart.  Your provider may recommend changes to the plan of care, follow-up visits, repeat testing, ask questions, or request an office visit to discuss these results. You may reply directly to this message or call the office at [phone] to provide information for the provider or set up an appointment. In some instances, you will be called with test results and recommendations. Please let us know if this is preferred and we will make note of this in your chart to provide this for you.    If you have not heard a response to your lab or test results in 5 business days from all results returning to MyChart, please call the office to let us know. We ask that you please avoid calling prior to this time unless there is an emergent concern. Due to high call volumes, this can delay the resulting process.   After Hours: For all non-emergency after hours needs, please call the office at [phone] and select the option to reach the on-call provider service. On-call services are shared between multiple [facility] offices and therefore it will not be possible to speak directly with your provider. On-call providers may provide medical advice and recommendations, but are unable to provide refills for maintenance medications.  For all emergency or urgent medical needs after normal business hours, we recommend that you seek care at the closest Urgent Care or Emergency Department to ensure appropriate treatment in a timely manner.  [facility] [place] has a 24 hour emergency room located on the ground floor for your  convenience.    Urgent Concerns During the Business Day Providers are seeing patients from 8AM to 5PM, Monday through Thursday, and 8AM to 12PM on Friday with a busy schedule and are most often not able to respond to non-urgent calls until the end of the day or the next business day. If you should have URGENT concerns during the day, please call and speak to the nurse or schedule a same day  appointment so that we can address your concern without delay.    Thank you, again, for choosing me as your health care partner. I appreciate your trust and look forward to learning more about you.    Donna Reedy, FNP-C

## 2023-05-17 LAB — TSH RFX ON ABNORMAL TO FREE T4: TSH: 1.06 u[IU]/mL (ref 0.450–4.500)

## 2023-05-25 ENCOUNTER — Encounter: Payer: Self-pay | Admitting: Family Medicine

## 2023-05-26 DIAGNOSIS — Z419 Encounter for procedure for purposes other than remedying health state, unspecified: Secondary | ICD-10-CM | POA: Diagnosis not present

## 2023-06-04 ENCOUNTER — Ambulatory Visit: Admitting: General Practice

## 2023-06-13 ENCOUNTER — Encounter: Admitting: Family Medicine

## 2023-06-25 DIAGNOSIS — Z419 Encounter for procedure for purposes other than remedying health state, unspecified: Secondary | ICD-10-CM | POA: Diagnosis not present

## 2023-07-26 DIAGNOSIS — Z419 Encounter for procedure for purposes other than remedying health state, unspecified: Secondary | ICD-10-CM | POA: Diagnosis not present

## 2023-08-25 DIAGNOSIS — Z419 Encounter for procedure for purposes other than remedying health state, unspecified: Secondary | ICD-10-CM | POA: Diagnosis not present

## 2023-09-20 ENCOUNTER — Ambulatory Visit: Admitting: Family Medicine

## 2023-09-23 ENCOUNTER — Emergency Department
Admission: EM | Admit: 2023-09-23 | Discharge: 2023-09-23 | Discharge status: Against Medical Advice | Attending: Emergency Medicine | Admitting: Emergency Medicine

## 2023-09-23 ENCOUNTER — Emergency Department

## 2023-09-23 ENCOUNTER — Other Ambulatory Visit: Payer: Self-pay

## 2023-09-23 ENCOUNTER — Ambulatory Visit

## 2023-09-23 DIAGNOSIS — Z5321 Procedure and treatment not carried out due to patient leaving prior to being seen by health care provider: Secondary | ICD-10-CM | POA: Diagnosis not present

## 2023-09-23 DIAGNOSIS — R6884 Jaw pain: Secondary | ICD-10-CM | POA: Diagnosis not present

## 2023-09-23 DIAGNOSIS — R079 Chest pain, unspecified: Secondary | ICD-10-CM | POA: Diagnosis not present

## 2023-09-23 DIAGNOSIS — R42 Dizziness and giddiness: Secondary | ICD-10-CM | POA: Diagnosis not present

## 2023-09-23 LAB — BASIC METABOLIC PANEL WITH GFR
Anion gap: 6 (ref 5–15)
BUN: 12 mg/dL (ref 6–20)
CO2: 25 mmol/L (ref 22–32)
Calcium: 9.9 mg/dL (ref 8.9–10.3)
Chloride: 107 mmol/L (ref 98–111)
Creatinine, Ser: 0.7 mg/dL (ref 0.44–1.00)
GFR, Estimated: >60 mL/min (ref >60)
Glucose, Bld: 102 mg/dL — ABNORMAL HIGH (ref 70–99)
Potassium: 4.1 mmol/L (ref 3.5–5.1)
Sodium: 138 mmol/L (ref 135–145)

## 2023-09-23 LAB — CBC
HCT: 42.1 % (ref 36.0–46.0)
Hemoglobin: 13.6 g/dL (ref 12.0–15.0)
MCH: 28 pg (ref 26.0–34.0)
MCHC: 32.3 g/dL (ref 30.0–36.0)
MCV: 86.6 fL (ref 80.0–100.0)
Platelets: 390 K/uL (ref 150–400)
RBC: 4.86 MIL/uL (ref 3.87–5.11)
RDW: 12.6 % (ref 11.5–15.5)
WBC: 12.4 K/uL — ABNORMAL HIGH (ref 4.0–10.5)
nRBC: 0 % (ref 0.0–0.2)

## 2023-09-23 LAB — TROPONIN I (HIGH SENSITIVITY): Troponin I (High Sensitivity): <2 ng/L (ref <18)

## 2023-09-23 NOTE — ED Notes (Signed)
Pt stated she is leaving. 

## 2023-09-23 NOTE — ED Triage Notes (Signed)
 Pt presents via POV c/o feeling of acid reflux, dizziness, and jaw pain. Reports feeling chest pain x2 days and jaw pain started today. Denies SOB. Ambulatory to triage.

## 2023-09-24 ENCOUNTER — Ambulatory Visit: Admitting: Family Medicine

## 2023-09-25 DIAGNOSIS — Z419 Encounter for procedure for purposes other than remedying health state, unspecified: Secondary | ICD-10-CM | POA: Diagnosis not present

## 2023-10-26 DIAGNOSIS — Z419 Encounter for procedure for purposes other than remedying health state, unspecified: Secondary | ICD-10-CM | POA: Diagnosis not present

## 2023-11-02 ENCOUNTER — Telehealth: Admitting: Emergency Medicine

## 2023-11-02 DIAGNOSIS — R300 Dysuria: Secondary | ICD-10-CM | POA: Diagnosis not present

## 2023-11-02 MED ORDER — CEPHALEXIN 500 MG PO CAPS
500.0000 mg | ORAL_CAPSULE | Freq: Two times a day (BID) | ORAL | 0 refills | Status: AC
Start: 2023-11-02 — End: ?

## 2023-11-02 NOTE — Progress Notes (Signed)
E-Visit for Urinary Problems  We are sorry that you are not feeling well.  Here is how we plan to help!  Based on what you shared with me it looks like you most likely have a simple urinary tract infection.  A UTI (Urinary Tract Infection) is a bacterial infection of the bladder.  Most cases of urinary tract infections are simple to treat but a key part of your care is to encourage you to drink plenty of fluids and watch your symptoms carefully.  I have prescribed Keflex 500 mg twice a day for 7 days.  Your symptoms should gradually improve. Call us if the burning in your urine worsens, you develop worsening fever, back pain or pelvic pain or if your symptoms do not resolve after completing the antibiotic.  Urinary tract infections can be prevented by drinking plenty of water to keep your body hydrated.  Also be sure when you wipe, wipe from front to back and don't hold it in!  If possible, empty your bladder every 4 hours.  HOME CARE Drink plenty of fluids Compete the full course of the antibiotics even if the symptoms resolve Remember, when you need to go.go. Holding in your urine can increase the likelihood of getting a UTI! GET HELP RIGHT AWAY IF: You cannot urinate You get a high fever Worsening back pain occurs You see blood in your urine You feel sick to your stomach or throw up You feel like you are going to pass out  MAKE SURE YOU  Understand these instructions. Will watch your condition. Will get help right away if you are not doing well or get worse.   Thank you for choosing an e-visit.  Your e-visit answers were reviewed by a board certified advanced clinical practitioner to complete your personal care plan. Depending upon the condition, your plan could have included both over the counter or prescription medications.  Please review your pharmacy choice. Make sure the pharmacy is open so you can pick up prescription now. If there is a problem, you may contact your  provider through MyChart messaging and have the prescription routed to another pharmacy.  Your safety is important to us. If you have drug allergies check your prescription carefully.   For the next 24 hours you can use MyChart to ask questions about today's visit, request a non-urgent call back, or ask for a work or school excuse. You will get an email in the next two days asking about your experience. I hope that your e-visit has been valuable and will speed your recovery.  Approximately 5 minutes was used in reviewing the patient's chart, questionnaire, prescribing medications, and documentation.  

## 2023-11-25 DIAGNOSIS — Z419 Encounter for procedure for purposes other than remedying health state, unspecified: Secondary | ICD-10-CM | POA: Diagnosis not present

## 2023-11-28 ENCOUNTER — Ambulatory Visit: Admitting: Family Medicine
